# Patient Record
Sex: Female | Born: 1975
Health system: Southern US, Community
[De-identification: ages and names within clinical notes are randomized; demographics above are authoritative.]

## PROBLEM LIST (undated history)

## (undated) DIAGNOSIS — F32A Depression, unspecified: Secondary | ICD-10-CM

## (undated) DIAGNOSIS — M1990 Unspecified osteoarthritis, unspecified site: Secondary | ICD-10-CM

## (undated) DIAGNOSIS — D241 Benign neoplasm of right breast: Secondary | ICD-10-CM

## (undated) DIAGNOSIS — F329 Major depressive disorder, single episode, unspecified: Secondary | ICD-10-CM

## (undated) DIAGNOSIS — Z8614 Personal history of Methicillin resistant Staphylococcus aureus infection: Secondary | ICD-10-CM

## (undated) DIAGNOSIS — F410 Panic disorder [episodic paroxysmal anxiety] without agoraphobia: Secondary | ICD-10-CM

## (undated) HISTORY — DX: Unspecified osteoarthritis, unspecified site: M19.90

---

## 1997-05-26 ENCOUNTER — Other Ambulatory Visit: Admission: RE | Admit: 1997-05-26 | Discharge: 1997-05-26 | Payer: Self-pay | Admitting: Family Medicine

## 1997-08-09 ENCOUNTER — Other Ambulatory Visit: Admission: RE | Admit: 1997-08-09 | Discharge: 1997-08-09 | Payer: Self-pay | Admitting: Family Medicine

## 1997-08-12 ENCOUNTER — Inpatient Hospital Stay (HOSPITAL_COMMUNITY): Admission: AD | Admit: 1997-08-12 | Discharge: 1997-08-12 | Payer: Self-pay | Admitting: Family Medicine

## 1997-09-07 ENCOUNTER — Other Ambulatory Visit: Admission: RE | Admit: 1997-09-07 | Discharge: 1997-09-07 | Payer: Self-pay | Admitting: Family Medicine

## 1998-04-14 ENCOUNTER — Encounter: Payer: Self-pay | Admitting: Emergency Medicine

## 1998-04-14 ENCOUNTER — Emergency Department (HOSPITAL_COMMUNITY): Admission: EM | Admit: 1998-04-14 | Discharge: 1998-04-14 | Payer: Self-pay | Admitting: Emergency Medicine

## 1998-09-14 ENCOUNTER — Emergency Department (HOSPITAL_COMMUNITY): Admission: EM | Admit: 1998-09-14 | Discharge: 1998-09-14 | Payer: Self-pay | Admitting: Emergency Medicine

## 1998-09-15 ENCOUNTER — Encounter: Payer: Self-pay | Admitting: Emergency Medicine

## 1999-05-22 ENCOUNTER — Inpatient Hospital Stay (HOSPITAL_COMMUNITY): Admission: EM | Admit: 1999-05-22 | Discharge: 1999-05-22 | Payer: Self-pay | Admitting: Obstetrics & Gynecology

## 1999-07-31 ENCOUNTER — Emergency Department (HOSPITAL_COMMUNITY): Admission: EM | Admit: 1999-07-31 | Discharge: 1999-07-31 | Payer: Self-pay | Admitting: Emergency Medicine

## 1999-07-31 ENCOUNTER — Encounter: Payer: Self-pay | Admitting: Emergency Medicine

## 1999-08-01 ENCOUNTER — Ambulatory Visit (HOSPITAL_BASED_OUTPATIENT_CLINIC_OR_DEPARTMENT_OTHER): Admission: RE | Admit: 1999-08-01 | Discharge: 1999-08-01 | Payer: Self-pay | Admitting: Orthopedic Surgery

## 1999-08-01 HISTORY — PX: MINOR IRRIGATION AND DEBRIDEMENT OF WOUND: SHX6239

## 1999-08-01 HISTORY — PX: ORIF FINGER FRACTURE: SHX2122

## 1999-08-23 ENCOUNTER — Ambulatory Visit (HOSPITAL_BASED_OUTPATIENT_CLINIC_OR_DEPARTMENT_OTHER): Admission: RE | Admit: 1999-08-23 | Discharge: 1999-08-23 | Payer: Self-pay | Admitting: Orthopedic Surgery

## 1999-08-23 HISTORY — PX: FIXATION DEVICE REMOVAL: SHX950

## 1999-08-23 HISTORY — PX: OSTEOPLASTY MCP / PHALANX: SUR971

## 1999-10-30 ENCOUNTER — Inpatient Hospital Stay (HOSPITAL_COMMUNITY): Admission: AD | Admit: 1999-10-30 | Discharge: 1999-10-30 | Payer: Self-pay | Admitting: *Deleted

## 1999-10-30 ENCOUNTER — Encounter: Payer: Self-pay | Admitting: *Deleted

## 1999-10-31 ENCOUNTER — Other Ambulatory Visit: Admission: RE | Admit: 1999-10-31 | Discharge: 1999-10-31 | Payer: Self-pay | Admitting: Obstetrics

## 1999-11-03 ENCOUNTER — Inpatient Hospital Stay (HOSPITAL_COMMUNITY): Admission: AD | Admit: 1999-11-03 | Discharge: 1999-11-03 | Payer: Self-pay | Admitting: Obstetrics & Gynecology

## 1999-11-11 ENCOUNTER — Inpatient Hospital Stay (HOSPITAL_COMMUNITY): Admission: AD | Admit: 1999-11-11 | Discharge: 1999-11-11 | Payer: Self-pay | Admitting: Obstetrics

## 1999-11-17 ENCOUNTER — Inpatient Hospital Stay (HOSPITAL_COMMUNITY): Admission: AD | Admit: 1999-11-17 | Discharge: 1999-11-17 | Payer: Self-pay | Admitting: Obstetrics

## 2000-01-25 ENCOUNTER — Emergency Department (HOSPITAL_COMMUNITY): Admission: EM | Admit: 2000-01-25 | Discharge: 2000-01-25 | Payer: Self-pay

## 2000-04-11 ENCOUNTER — Observation Stay (HOSPITAL_COMMUNITY): Admission: AD | Admit: 2000-04-11 | Discharge: 2000-04-12 | Payer: Self-pay | Admitting: Obstetrics

## 2000-04-25 ENCOUNTER — Inpatient Hospital Stay (HOSPITAL_COMMUNITY): Admission: AD | Admit: 2000-04-25 | Discharge: 2000-04-25 | Payer: Self-pay | Admitting: Obstetrics

## 2000-06-24 ENCOUNTER — Inpatient Hospital Stay (HOSPITAL_COMMUNITY): Admission: AD | Admit: 2000-06-24 | Discharge: 2000-06-26 | Payer: Self-pay | Admitting: Obstetrics

## 2004-10-02 ENCOUNTER — Emergency Department (HOSPITAL_COMMUNITY): Admission: EM | Admit: 2004-10-02 | Discharge: 2004-10-02 | Payer: Self-pay | Admitting: Emergency Medicine

## 2005-03-08 ENCOUNTER — Inpatient Hospital Stay (HOSPITAL_COMMUNITY): Admission: AD | Admit: 2005-03-08 | Discharge: 2005-03-08 | Payer: Self-pay | Admitting: Obstetrics

## 2005-04-16 ENCOUNTER — Emergency Department (HOSPITAL_COMMUNITY): Admission: EM | Admit: 2005-04-16 | Discharge: 2005-04-17 | Payer: Self-pay | Admitting: Emergency Medicine

## 2005-04-23 ENCOUNTER — Inpatient Hospital Stay (HOSPITAL_COMMUNITY): Admission: AD | Admit: 2005-04-23 | Discharge: 2005-04-23 | Payer: Self-pay | Admitting: Obstetrics

## 2005-08-16 ENCOUNTER — Emergency Department (HOSPITAL_COMMUNITY): Admission: EM | Admit: 2005-08-16 | Discharge: 2005-08-16 | Payer: Self-pay | Admitting: Emergency Medicine

## 2006-11-09 ENCOUNTER — Emergency Department (HOSPITAL_COMMUNITY): Admission: EM | Admit: 2006-11-09 | Discharge: 2006-11-09 | Payer: Self-pay | Admitting: Emergency Medicine

## 2006-11-11 ENCOUNTER — Emergency Department (HOSPITAL_COMMUNITY): Admission: EM | Admit: 2006-11-11 | Discharge: 2006-11-11 | Payer: Self-pay | Admitting: Family Medicine

## 2006-11-11 ENCOUNTER — Emergency Department (HOSPITAL_COMMUNITY): Admission: EM | Admit: 2006-11-11 | Discharge: 2006-11-11 | Payer: Self-pay | Admitting: Emergency Medicine

## 2007-03-15 ENCOUNTER — Emergency Department (HOSPITAL_COMMUNITY): Admission: EM | Admit: 2007-03-15 | Discharge: 2007-03-15 | Payer: Self-pay | Admitting: Family Medicine

## 2007-10-01 ENCOUNTER — Emergency Department (HOSPITAL_COMMUNITY): Admission: EM | Admit: 2007-10-01 | Discharge: 2007-10-01 | Payer: Self-pay | Admitting: Emergency Medicine

## 2009-04-01 ENCOUNTER — Emergency Department (HOSPITAL_COMMUNITY): Admission: EM | Admit: 2009-04-01 | Discharge: 2009-04-01 | Payer: Self-pay | Admitting: Family Medicine

## 2009-11-17 ENCOUNTER — Emergency Department (HOSPITAL_COMMUNITY): Admission: EM | Admit: 2009-11-17 | Discharge: 2009-11-17 | Payer: Self-pay | Admitting: Family Medicine

## 2009-12-21 ENCOUNTER — Emergency Department (HOSPITAL_COMMUNITY): Admission: EM | Admit: 2009-12-21 | Discharge: 2009-12-21 | Payer: Self-pay | Admitting: Emergency Medicine
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
## 2010-09-10 ENCOUNTER — Inpatient Hospital Stay (INDEPENDENT_AMBULATORY_CARE_PROVIDER_SITE_OTHER)
Admission: RE | Admit: 2010-09-10 | Discharge: 2010-09-10 | Disposition: A | Discharge status: Home / Self Care | Payer: Self-pay | Source: Ambulatory Visit | Attending: Family Medicine | Admitting: Family Medicine

## 2010-09-10 DIAGNOSIS — M67919 Unspecified disorder of synovium and tendon, unspecified shoulder: Secondary | ICD-10-CM

## 2010-09-10 DIAGNOSIS — M62838 Other muscle spasm: Secondary | ICD-10-CM

## 2010-09-20 ENCOUNTER — Ambulatory Visit
Admission: RE | Admit: 2010-09-20 | Discharge: 2010-09-20 | Disposition: A | Discharge status: Home / Self Care | Payer: Self-pay | Source: Ambulatory Visit | Attending: Family Medicine | Admitting: Family Medicine

## 2010-09-20 ENCOUNTER — Other Ambulatory Visit: Payer: Self-pay | Admitting: Family Medicine

## 2010-09-20 DIAGNOSIS — M25519 Pain in unspecified shoulder: Secondary | ICD-10-CM

## 2010-11-16 LAB — CULTURE, ROUTINE-ABSCESS

## 2010-12-07 LAB — CULTURE, ROUTINE-ABSCESS

## 2011-01-24 ENCOUNTER — Ambulatory Visit: Payer: Medicaid Other | Attending: Physical Medicine and Rehabilitation | Admitting: Physical Therapy

## 2011-01-24 DIAGNOSIS — R293 Abnormal posture: Secondary | ICD-10-CM | POA: Insufficient documentation

## 2011-01-24 DIAGNOSIS — IMO0001 Reserved for inherently not codable concepts without codable children: Secondary | ICD-10-CM | POA: Insufficient documentation

## 2011-01-24 DIAGNOSIS — M25519 Pain in unspecified shoulder: Secondary | ICD-10-CM | POA: Insufficient documentation

## 2011-02-07 ENCOUNTER — Ambulatory Visit: Payer: Medicaid Other | Attending: Physical Medicine and Rehabilitation | Admitting: Physical Therapy

## 2011-02-07 DIAGNOSIS — IMO0001 Reserved for inherently not codable concepts without codable children: Secondary | ICD-10-CM | POA: Insufficient documentation

## 2011-02-07 DIAGNOSIS — M25519 Pain in unspecified shoulder: Secondary | ICD-10-CM | POA: Insufficient documentation

## 2011-02-07 DIAGNOSIS — R293 Abnormal posture: Secondary | ICD-10-CM | POA: Insufficient documentation

## 2011-02-14 ENCOUNTER — Encounter: Payer: Medicaid Other | Admitting: Physical Therapy

## 2011-02-23 ENCOUNTER — Ambulatory Visit: Payer: Medicaid Other | Admitting: Physical Therapy

## 2011-11-20 ENCOUNTER — Emergency Department (HOSPITAL_COMMUNITY)
Admission: EM | Admit: 2011-11-20 | Discharge: 2011-11-20 | Discharge status: Against Medical Advice | Payer: Self-pay | Attending: Emergency Medicine | Admitting: Emergency Medicine

## 2011-11-20 ENCOUNTER — Encounter (HOSPITAL_COMMUNITY): Payer: Self-pay | Admitting: Emergency Medicine

## 2011-11-20 DIAGNOSIS — R110 Nausea: Secondary | ICD-10-CM | POA: Insufficient documentation

## 2011-11-20 MED ORDER — ONDANSETRON 4 MG PO TBDP
ORAL_TABLET | ORAL | Status: AC
  Filled 2011-11-20: qty 2

## 2011-11-20 MED ORDER — ONDANSETRON 4 MG PO TBDP
8.0000 mg | ORAL_TABLET | Freq: Once | ORAL | Status: AC
  Administered 2011-11-20: 8 mg via ORAL

## 2011-11-20 NOTE — ED Notes (Signed)
Pt c/o feeling nausea since yesterday. Pt ate at [facility] yesterday when symptoms began. Pt denies any pain at present.

## 2011-11-23 ENCOUNTER — Encounter (HOSPITAL_COMMUNITY): Payer: Self-pay

## 2011-11-23 ENCOUNTER — Inpatient Hospital Stay (HOSPITAL_COMMUNITY)
Admission: AD | Admit: 2011-11-23 | Discharge: 2011-11-24 | Disposition: A | Discharge status: Home / Self Care | Payer: Medicaid Other | Source: Ambulatory Visit | Attending: Obstetrics & Gynecology | Admitting: Obstetrics & Gynecology

## 2011-11-23 DIAGNOSIS — R109 Unspecified abdominal pain: Secondary | ICD-10-CM | POA: Insufficient documentation

## 2011-11-23 DIAGNOSIS — N946 Dysmenorrhea, unspecified: Secondary | ICD-10-CM | POA: Insufficient documentation

## 2011-11-23 DIAGNOSIS — R9389 Abnormal findings on diagnostic imaging of other specified body structures: Secondary | ICD-10-CM | POA: Insufficient documentation

## 2011-11-23 DIAGNOSIS — N390 Urinary tract infection, site not specified: Secondary | ICD-10-CM | POA: Insufficient documentation

## 2011-11-23 DIAGNOSIS — R112 Nausea with vomiting, unspecified: Secondary | ICD-10-CM | POA: Insufficient documentation

## 2011-11-23 DIAGNOSIS — R809 Proteinuria, unspecified: Secondary | ICD-10-CM | POA: Insufficient documentation

## 2011-11-23 HISTORY — DX: Personal history of Methicillin resistant Staphylococcus aureus infection: Z86.14

## 2011-11-23 LAB — URINE MICROSCOPIC-ADD ON

## 2011-11-23 LAB — CBC
Hemoglobin: 12.1 g/dL (ref 12.0–15.0)
MCH: 32.7 pg (ref 26.0–34.0)
RBC: 3.7 MIL/uL — ABNORMAL LOW (ref 3.87–5.11)

## 2011-11-23 LAB — HCG, QUANTITATIVE, PREGNANCY: hCG, Beta Chain, Quant, S: <1 m[IU]/mL (ref <5)

## 2011-11-23 LAB — URINALYSIS, ROUTINE W REFLEX MICROSCOPIC
Nitrite: POSITIVE — AB
Protein, ur: >300 mg/dL — AB
Urobilinogen, UA: 1 mg/dL (ref 0.0–1.0)

## 2011-11-23 LAB — POCT PREGNANCY, URINE: Preg Test, Ur: NEGATIVE

## 2011-11-23 MED ORDER — KETOROLAC TROMETHAMINE 60 MG/2ML IM SOLN
60.0000 mg | INTRAMUSCULAR | Status: AC
  Administered 2011-11-24: 60 mg via INTRAMUSCULAR
  Filled 2011-11-23: qty 2

## 2011-11-23 MED ORDER — ONDANSETRON 8 MG PO TBDP
8.0000 mg | ORAL_TABLET | ORAL | Status: AC
  Administered 2011-11-24: 8 mg via ORAL
  Filled 2011-11-23: qty 1

## 2011-11-23 NOTE — MAU Note (Signed)
Patient is in with c/o intense nausea, constant lower back and abdominal pain. She denies or vaginal discharge. She states that she started having brown blood and only when she wipe today. She states that she period twice in august (3rd and 30th).

## 2011-11-23 NOTE — MAU Note (Signed)
Extremely nauseated for 4-5 days. No vomiting. Today started having cramping in lower abdomen. Had brown vag spotting today and then bright red. Mostly sees bright blood when wipes

## 2011-11-23 NOTE — MAU Provider Note (Signed)
Chief Complaint: Nausea and Abdominal Cramping   First Provider Initiated Contact with Patient 11/23/11 2328     SUBJECTIVE HPI: Julia Newman is a 36 y.o. [id number] who presents to maternity admissions reporting a faintly positive preg test at home 2 days ago and dark brown spotting leading to heavier bright red bleeding today like a normal period.  Patient's last menstrual period was 11/23/2011.  She is also having abdominal cramping with sharp pain on the right side and nausea x2 days with vomiting 1/day.  She denies vaginal itching/burning, urinary symptoms, h/a, dizziness, or fever/chills.    Pt has regular Gyn care/Paps with a family practice physician outside the [facility] system. She desires to change providers and is interested in the hospital Gyn clinic.  Past Medical History  Diagnosis Date  . Hx MRSA infection     on face and upper thigh   Past Surgical History  Procedure Date  . Finger surgery    History   Social History  . Marital Status: Single    Spouse Name: N/A    Number of Children: N/A  . Years of Education: N/A   Occupational History  . Not on file.   Social History Main Topics  . Smoking status: Current Every Day Smoker    Types: Cigarettes  . Smokeless tobacco: Not on file  . Alcohol Use: Yes     Social  . Drug Use: No  . Sexually Active: Yes    Birth Control/ Protection: None   Other Topics Concern  . Not on file   Social History Narrative  . No narrative on file   No current facility-administered medications on file prior to encounter.   No current outpatient prescriptions on file prior to encounter.   No Known Allergies  ROS: Pertinent items in HPI  OBJECTIVE Blood pressure 120/77, pulse 78, temperature 98.1 F (36.7 C), temperature source Oral, resp. rate 20, height 5' 3.5" (1.613 m), weight 102.331 kg (225 lb 9.6 oz), last menstrual period 11/23/2011. GENERAL: Well-developed, well-nourished female in no acute distress.  HEENT:  Normocephalic HEART: normal rate RESP: normal effort ABDOMEN: Soft, mildly tender in suprapubic region and RLQ, no rebound tenderness, guarding, or tenderness at McBurney's point Negative CVA tenderness EXTREMITIES: Nontender, no edema NEURO: Alert and oriented Pelvic exam: Cervix pink, visually closed, with nabothian cysts, moderate amount bright red vaginal bleeding without clots, vaginal walls and external genitalia normal Bimanual exam: Cervix 0/long/high, firm, anterior, neg CMT, uterus mildly tender, nonenlarged, mild tenderness in R adnexal region, none on left, no enlargement or mass bilaterally  LAB RESULTS Results for orders placed during the hospital encounter of 11/23/11 (from the past 24 hour(s))  URINALYSIS, ROUTINE W REFLEX MICROSCOPIC     Status: Abnormal   Collection Time   11/23/11 10:35 PM      Component Value Range   Color, Urine RED (*) YELLOW   APPearance CLOUDY (*) CLEAR   Specific Gravity, Urine >1.030 (*) 1.005 - 1.030   pH 5.0  5.0 - 8.0   Glucose, UA NEGATIVE  NEGATIVE mg/dL   Hgb urine dipstick LARGE (*) NEGATIVE   Bilirubin Urine NEGATIVE  NEGATIVE   Ketones, ur 15 (*) NEGATIVE mg/dL   Protein, ur >454 (*) NEGATIVE mg/dL   Urobilinogen, UA 1.0  0.0 - 1.0 mg/dL   Nitrite POSITIVE (*) NEGATIVE   Leukocytes, UA TRACE (*) NEGATIVE  URINE MICROSCOPIC-ADD ON     Status: Abnormal   Collection Time   11/23/11 10:35 PM  Component Value Range   Squamous Epithelial / LPF FEW (*) RARE   WBC, UA 3-6  <3 WBC/hpf   RBC / HPF TOO NUMEROUS TO COUNT  <3 RBC/hpf   Bacteria, UA MANY (*) RARE  POCT PREGNANCY, URINE     Status: Normal   Collection Time   11/23/11 10:37 PM      Component Value Range   Preg Test, Ur NEGATIVE  NEGATIVE  ABO/RH     Status: Normal   Collection Time   11/23/11 11:07 PM      Component Value Range   ABO/RH(D) O POS    CBC     Status: Abnormal   Collection Time   11/23/11 11:08 PM      Component Value Range   WBC 7.3  4.0 - 10.5 K/uL    RBC 3.70 (*) 3.87 - 5.11 MIL/uL   Hemoglobin 12.1  12.0 - 15.0 g/dL   HCT 11.9  14.7 - 82.9 %   MCV 99.7  78.0 - 100.0 fL   MCH 32.7  26.0 - 34.0 pg   MCHC 32.8  30.0 - 36.0 g/dL   RDW 56.2  13.0 - 86.5 %   Platelets 279  150 - 400 K/uL    IMAGING US Transvaginal Non-ob  11/24/2011  *RADIOLOGY REPORT*  Clinical Data: Right lower quadrant pain.  Abnormal uterine bleeding  TRANSABDOMINAL AND TRANSVAGINAL ULTRASOUND OF PELVIS Technique:  Both transabdominal and transvaginal ultrasound examinations of the pelvis were performed. Transabdominal technique was performed for global imaging of the pelvis including uterus, ovaries, adnexal regions, and pelvic cul-de-sac.  It was necessary to proceed with endovaginal exam following the transabdominal exam to visualize the the ovaries and endometrium.  Comparison:  None  Findings:  Uterus: Normal in size of 10.3 x 5.5 x 5.6 cm.  Endometrium: Endometrium is thickened and heterogeneous in echotexture measuring 16 mm.  Right ovary:  Normal in size and appearance measuring 3.2 feet 2.9 x 1.8 cm.  Left ovary: Normal size and appearance measuring 2.9 1.5 x 2.2 cm.  Other findings: No free fluid  IMPRESSION:  Thickened and heterogeneous endometrium suggesting blood products within the canal.  Recommend follow-up ultrasound in 6 weeks to demonstrate resolution.   Original Report Authenticated By: Genevive Bi, M.D.    US Pelvis Complete  11/24/2011  *RADIOLOGY REPORT*  Clinical Data: Right lower quadrant pain.  Abnormal uterine bleeding  TRANSABDOMINAL AND TRANSVAGINAL ULTRASOUND OF PELVIS Technique:  Both transabdominal and transvaginal ultrasound examinations of the pelvis were performed. Transabdominal technique was performed for global imaging of the pelvis including uterus, ovaries, adnexal regions, and pelvic cul-de-sac.  It was necessary to proceed with endovaginal exam following the transabdominal exam to visualize the the ovaries and endometrium.  Comparison:   None  Findings:  Uterus: Normal in size of 10.3 x 5.5 x 5.6 cm.  Endometrium: Endometrium is thickened and heterogeneous in echotexture measuring 16 mm.  Right ovary:  Normal in size and appearance measuring 3.2 feet 2.9 x 1.8 cm.  Left ovary: Normal size and appearance measuring 2.9 1.5 x 2.2 cm.  Other findings: No free fluid  IMPRESSION:  Thickened and heterogeneous endometrium suggesting blood products within the canal.  Recommend follow-up ultrasound in 6 weeks to demonstrate resolution.   Original Report Authenticated By: Genevive Bi, M.D.     ASSESSMENT 1. UTI (lower urinary tract infection)   2. Nausea & vomiting   3. Thickened endometrium   4. Dysmenorrhea   5. Proteinuria  Some suspicion for pyelonephritis but n/v only indicator with normal WBC count and no CVA tenderness  PLAN Toradol 60mg  IM x1 dose in MAU with some relief of pain Discharge home Urine sent for culture Cipro 500 mg PO BID x7 days Zofran 8mg  ODT Q8 hours Ibuprofen 600 mg PO Q6 hours Percocet 5/325, 1-2 tabs Q8 hours PRN x15 tabs Message sent to Gyn clinic for f/u and endometrial biopsy Return to MAU with worsening symptoms or lack of improvement   Sharen Counter Certified Nurse-Midwife 11/23/2011  11:46 PM

## 2011-11-24 ENCOUNTER — Inpatient Hospital Stay (HOSPITAL_COMMUNITY): Payer: Medicaid Other

## 2011-11-24 DIAGNOSIS — R809 Proteinuria, unspecified: Secondary | ICD-10-CM | POA: Diagnosis present

## 2011-11-24 DIAGNOSIS — N390 Urinary tract infection, site not specified: Secondary | ICD-10-CM

## 2011-11-24 DIAGNOSIS — N946 Dysmenorrhea, unspecified: Secondary | ICD-10-CM | POA: Diagnosis present

## 2011-11-24 DIAGNOSIS — R9389 Abnormal findings on diagnostic imaging of other specified body structures: Secondary | ICD-10-CM | POA: Diagnosis present

## 2011-11-24 MED ORDER — ONDANSETRON 8 MG PO TBDP: 8.0000 mg | ORAL_TABLET | Freq: Three times a day (TID) | ORAL | Status: DC | PRN

## 2011-11-24 MED ORDER — CIPROFLOXACIN HCL 500 MG PO TABS: 500.0000 mg | ORAL_TABLET | Freq: Two times a day (BID) | ORAL | Status: DC

## 2011-11-24 MED ORDER — OXYCODONE-ACETAMINOPHEN 5-325 MG PO TABS: 1.0000 | ORAL_TABLET | Freq: Four times a day (QID) | ORAL | Status: DC | PRN

## 2011-11-24 MED ORDER — IBUPROFEN 600 MG PO TABS: 600.0000 mg | ORAL_TABLET | Freq: Four times a day (QID) | ORAL | Status: DC | PRN

## 2011-11-25 LAB — URINE CULTURE: Colony Count: NO GROWTH

## 2011-11-26 LAB — GC/CHLAMYDIA PROBE AMP, GENITAL: GC Probe Amp, Genital: NEGATIVE

## 2011-12-19 ENCOUNTER — Other Ambulatory Visit: Payer: Medicaid Other | Admitting: Advanced Practice Midwife

## 2012-04-26 ENCOUNTER — Inpatient Hospital Stay (HOSPITAL_COMMUNITY)
Admission: AD | Admit: 2012-04-26 | Discharge: 2012-04-27 | Disposition: A | Discharge status: Home / Self Care | Payer: Medicaid Other | Source: Ambulatory Visit | Attending: Obstetrics & Gynecology | Admitting: Obstetrics & Gynecology

## 2012-04-26 ENCOUNTER — Inpatient Hospital Stay (HOSPITAL_COMMUNITY): Payer: Medicaid Other

## 2012-04-26 ENCOUNTER — Encounter (HOSPITAL_COMMUNITY): Payer: Self-pay | Admitting: *Deleted

## 2012-04-26 DIAGNOSIS — N841 Polyp of cervix uteri: Secondary | ICD-10-CM

## 2012-04-26 DIAGNOSIS — N938 Other specified abnormal uterine and vaginal bleeding: Secondary | ICD-10-CM | POA: Insufficient documentation

## 2012-04-26 DIAGNOSIS — N949 Unspecified condition associated with female genital organs and menstrual cycle: Secondary | ICD-10-CM | POA: Insufficient documentation

## 2012-04-26 DIAGNOSIS — N840 Polyp of corpus uteri: Secondary | ICD-10-CM | POA: Insufficient documentation

## 2012-04-26 LAB — URINALYSIS, ROUTINE W REFLEX MICROSCOPIC
Bilirubin Urine: NEGATIVE
Specific Gravity, Urine: >1.03 — ABNORMAL HIGH (ref 1.005–1.030)
Urobilinogen, UA: 0.2 mg/dL (ref 0.0–1.0)
pH: 5.5 (ref 5.0–8.0)

## 2012-04-26 LAB — CBC
MCH: 33.2 pg (ref 26.0–34.0)
Platelets: 259 10*3/uL (ref 150–400)
RBC: 3.67 MIL/uL — ABNORMAL LOW (ref 3.87–5.11)

## 2012-04-26 LAB — URINE MICROSCOPIC-ADD ON

## 2012-04-26 MED ORDER — IBUPROFEN 600 MG PO TABS
600.0000 mg | ORAL_TABLET | Freq: Once | ORAL | Status: AC
  Administered 2012-04-26: 600 mg via ORAL
  Filled 2012-04-26: qty 1

## 2012-04-26 NOTE — MAU Provider Note (Signed)
History     CSN: [id number]  Arrival date and time: 04/26/12 2114   None     Chief Complaint  Patient presents with  . Vaginal Bleeding   HPI  Pt is a [id number] here with report of Patient's last menstrual period was 04/23/2012.  No report of cramping.  No history of fibroids.  Reports a regular cycle.  +concern due to blood clot that fell to floor that was hard.  It was approximate size of grape.  Bleeding is reported as light as this time.     Past Medical History  Diagnosis Date  . Hx MRSA infection     on face and upper thigh    Past Surgical History  Procedure Laterality Date  . Finger surgery      History reviewed. No pertinent family history.  History  Substance Use Topics  . Smoking status: Current Every Day Smoker    Types: Cigarettes  . Smokeless tobacco: Not on file  . Alcohol Use: Yes     Comment: Social    Allergies: No Known Allergies  Prescriptions prior to admission  Medication Sig Dispense Refill  . Ibuprofen (MIDOL) 200 MG CAPS Take 2-4 capsules by mouth daily as needed (For pain).        Review of Systems  Genitourinary:       Vaginal bleeding  Neurological: Positive for headaches.  All other systems reviewed and are negative.   Physical Exam   Blood pressure 124/77, pulse 72, temperature 98.1 F (36.7 C), temperature source Oral, resp. rate 18, last menstrual period 04/23/2012.  Physical Exam  Constitutional: She is oriented to person, place, and time. She appears well-developed and well-nourished. No distress.  HENT:  Head: Normocephalic.  Neck: Normal range of motion. Neck supple.  Cardiovascular: Normal rate, regular rhythm and normal heart sounds.   Respiratory: Effort normal and breath sounds normal.  GI: Soft. There is no tenderness.  Genitourinary: Bleeding: negative clots.  Scant bleeding; shown tissue by pt that came out of vagina at home > polyp like lesion.  Exam - small stalk remaining in cervical os > removed by Dr.  Macon Large  Neurological: She is alert and oriented to person, place, and time. She has normal reflexes.  Skin: Skin is warm and dry.    MAU Course  Procedures Results for orders placed during the hospital encounter of 04/26/12 (from the past 24 hour(s))  URINALYSIS, ROUTINE W REFLEX MICROSCOPIC     Status: Abnormal   Collection Time    04/26/12  9:18 PM      Result Value Range   Color, Urine YELLOW  YELLOW   APPearance CLEAR  CLEAR   Specific Gravity, Urine >1.030 (*) 1.005 - 1.030   pH 5.5  5.0 - 8.0   Glucose, UA NEGATIVE  NEGATIVE mg/dL   Hgb urine dipstick MODERATE (*) NEGATIVE   Bilirubin Urine NEGATIVE  NEGATIVE   Ketones, ur NEGATIVE  NEGATIVE mg/dL   Protein, ur NEGATIVE  NEGATIVE mg/dL   Urobilinogen, UA 0.2  0.0 - 1.0 mg/dL   Nitrite NEGATIVE  NEGATIVE   Leukocytes, UA NEGATIVE  NEGATIVE  URINE MICROSCOPIC-ADD ON     Status: Abnormal   Collection Time    04/26/12  9:18 PM      Result Value Range   Squamous Epithelial / LPF FEW (*) RARE   WBC, UA 0-2  <3 WBC/hpf   RBC / HPF 3-6  <3 RBC/hpf   Bacteria, UA FEW (*) RARE  POCT PREGNANCY, URINE     Status: None   Collection Time    04/26/12  9:29 PM      Result Value Range   Preg Test, Ur NEGATIVE  NEGATIVE  CBC     Status: Abnormal   Collection Time    04/26/12 10:23 PM      Result Value Range   WBC 7.1  4.0 - 10.5 K/uL   RBC 3.67 (*) 3.87 - 5.11 MIL/uL   Hemoglobin 12.2  12.0 - 15.0 g/dL   HCT 16.1  09.6 - 04.5 %   MCV 100.5 (*) 78.0 - 100.0 fL   MCH 33.2  26.0 - 34.0 pg   MCHC 33.1  30.0 - 36.0 g/dL   RDW 40.9  81.1 - 91.4 %   Platelets 259  150 - 400 K/uL   Ultrasound: IMPRESSION:  1. Four to five mm focus of heterogeneous endometrial echotexture  and suggestion of some heterogeneous endometrial vascularity on  power Doppler imaging. Overall endometrial thickness 6 mm. Follow-  up sonohystogram may be most helpful.  2. Otherwise normal uterus and adnexa. No pelvic free fluid.   Assessment and Plan   Polyp  Plan: Follow-up in gyn clinic Report increased bleeding or pain.  [facility] 04/26/2012, 10:11 PM

## 2012-04-26 NOTE — MAU Note (Signed)
Started her menstrual period on Wednesday. Passed a hard clot on the floor today and wanted to come get checked out.

## 2012-04-26 NOTE — MAU Note (Signed)
Hot flashed and headache

## 2012-04-27 DIAGNOSIS — N841 Polyp of cervix uteri: Secondary | ICD-10-CM

## 2012-04-28 NOTE — MAU Provider Note (Signed)

## 2012-08-07 ENCOUNTER — Emergency Department (HOSPITAL_COMMUNITY)
Admission: EM | Admit: 2012-08-07 | Discharge: 2012-08-07 | Disposition: A | Discharge status: Home / Self Care | Payer: Medicaid Other | Attending: Emergency Medicine | Admitting: Emergency Medicine

## 2012-08-07 ENCOUNTER — Emergency Department (HOSPITAL_COMMUNITY): Payer: Medicaid Other

## 2012-08-07 ENCOUNTER — Encounter (HOSPITAL_COMMUNITY): Payer: Self-pay | Admitting: *Deleted

## 2012-08-07 DIAGNOSIS — Z8614 Personal history of Methicillin resistant Staphylococcus aureus infection: Secondary | ICD-10-CM | POA: Insufficient documentation

## 2012-08-07 DIAGNOSIS — R079 Chest pain, unspecified: Secondary | ICD-10-CM | POA: Insufficient documentation

## 2012-08-07 DIAGNOSIS — R609 Edema, unspecified: Secondary | ICD-10-CM | POA: Insufficient documentation

## 2012-08-07 DIAGNOSIS — M549 Dorsalgia, unspecified: Secondary | ICD-10-CM | POA: Insufficient documentation

## 2012-08-07 DIAGNOSIS — F17200 Nicotine dependence, unspecified, uncomplicated: Secondary | ICD-10-CM | POA: Insufficient documentation

## 2012-08-07 DIAGNOSIS — R0602 Shortness of breath: Secondary | ICD-10-CM | POA: Insufficient documentation

## 2012-08-07 LAB — CBC
HCT: 35.9 % — ABNORMAL LOW (ref 36.0–46.0)
Platelets: 241 10*3/uL (ref 150–400)
RDW: 12.7 % (ref 11.5–15.5)
WBC: 6.4 10*3/uL (ref 4.0–10.5)

## 2012-08-07 LAB — POCT I-STAT TROPONIN I: Troponin i, poc: 0 ng/mL (ref 0.00–0.08)

## 2012-08-07 LAB — BASIC METABOLIC PANEL
CO2: 23 mEq/L (ref 19–32)
Calcium: 9.2 mg/dL (ref 8.4–10.5)
GFR calc non Af Amer: >90 mL/min (ref >90)
Sodium: 136 mEq/L (ref 135–145)

## 2012-08-07 MED ORDER — MORPHINE SULFATE 4 MG/ML IJ SOLN
4.0000 mg | Freq: Once | INTRAMUSCULAR | Status: AC
  Administered 2012-08-07: 4 mg via INTRAVENOUS
  Filled 2012-08-07: qty 1

## 2012-08-07 MED ORDER — ASPIRIN 81 MG PO CHEW
324.0000 mg | CHEWABLE_TABLET | Freq: Once | ORAL | Status: AC
  Administered 2012-08-07: 324 mg via ORAL
  Filled 2012-08-07: qty 4

## 2012-08-07 MED ORDER — POTASSIUM CHLORIDE CRYS ER 20 MEQ PO TBCR
20.0000 meq | EXTENDED_RELEASE_TABLET | Freq: Once | ORAL | Status: AC
  Administered 2012-08-07: 20 meq via ORAL
  Filled 2012-08-07: qty 1

## 2012-08-07 MED ORDER — KETOROLAC TROMETHAMINE 30 MG/ML IJ SOLN
30.0000 mg | Freq: Once | INTRAMUSCULAR | Status: AC
  Administered 2012-08-07: 30 mg via INTRAVENOUS
  Filled 2012-08-07: qty 1

## 2012-08-07 MED ORDER — ONDANSETRON HCL 4 MG/2ML IJ SOLN
4.0000 mg | Freq: Once | INTRAMUSCULAR | Status: AC
  Administered 2012-08-07: 4 mg via INTRAVENOUS
  Filled 2012-08-07: qty 2

## 2012-08-07 MED ORDER — LORAZEPAM 2 MG/ML IJ SOLN
1.0000 mg | Freq: Once | INTRAMUSCULAR | Status: AC
  Administered 2012-08-07: 1 mg via INTRAVENOUS
  Filled 2012-08-07: qty 1

## 2012-08-07 NOTE — ED Provider Notes (Signed)
History     CSN: [id number]  Arrival date & time 08/07/12  3086   First MD Initiated Contact with Patient 08/07/12 0158      Chief Complaint  Patient presents with  . Chest Pain    (Consider location/radiation/quality/duration/timing/severity/associated sxs/prior treatment) HPI Hx per PT - L sided back pain x 3 days now radiating to her L neck and L chest. C/o inc stress recently. SOB today. No N/V. No diaphoresis. No trauma.  No cough. No F/C. No leg pain, some chronic LE edema unchanged. Mod in severity. C/o depression "all of my life" denies any SI/ HI.    Past Medical History  Diagnosis Date  . Hx MRSA infection     on face and upper thigh    Past Surgical History  Procedure Laterality Date  . Finger surgery      No family history on file.  History  Substance Use Topics  . Smoking status: Current Some Day Smoker    Types: Cigarettes  . Smokeless tobacco: Not on file  . Alcohol Use: Yes     Comment: Social    OB History   Grav Para Term Preterm Abortions TAB SAB Ect Mult Living   6 3 2 1 3 2 1   2       Review of Systems  Constitutional: Negative for fever and chills.  HENT: Negative for neck pain and neck stiffness.   Eyes: Negative for pain.  Respiratory: Positive for shortness of breath.   Cardiovascular: Positive for chest pain.  Gastrointestinal: Negative for abdominal pain.  Genitourinary: Negative for dysuria.  Musculoskeletal: Negative for back pain.  Skin: Negative for rash.  Neurological: Negative for headaches.  All other systems reviewed and are negative.    Allergies  Review of patient's allergies indicates no known allergies.  Home Medications   Current Outpatient Rx  Name  Route  Sig  Dispense  Refill  . Ibuprofen (MIDOL) 200 MG CAPS   Oral   Take 2-4 capsules by mouth daily as needed (For pain).           BP 118/67  Pulse 79  Temp(Src) 98.6 F (37 C) (Oral)  Resp 18  Ht 5' 3.5" (1.613 m)  SpO2 100%  LMP  07/20/2012  Physical Exam  Constitutional: She is oriented to person, place, and time. She appears well-developed and well-nourished.  HENT:  Head: Normocephalic and atraumatic.  Eyes: Conjunctivae and EOM are normal. Pupils are equal, round, and reactive to light.  Neck: Trachea normal. Neck supple. No thyromegaly present.  Cardiovascular: Normal rate, regular rhythm, S1 normal, S2 normal and normal pulses.     No systolic murmur is present   No diastolic murmur is present  Pulses:      Radial pulses are 2+ on the right side, and 2+ on the left side.  Pulmonary/Chest: Effort normal and breath sounds normal. She has no wheezes. She has no rhonchi. She has no rales. She exhibits no tenderness.  Abdominal: Soft. Normal appearance and bowel sounds are normal. There is no tenderness. There is no CVA tenderness and negative Murphy's sign.  Musculoskeletal:  1 plus symmetric pretibial edema. calves nontender, no cords or erythema, negative Homans sign.  Neurological: She is alert and oriented to person, place, and time. She has normal strength. No cranial nerve deficit or sensory deficit. GCS eye subscore is 4. GCS verbal subscore is 5. GCS motor subscore is 6.  Skin: Skin is warm and dry. No rash noted.  She is not diaphoretic.  Psychiatric: She has a normal mood and affect. Her speech is normal.  Cooperative and appropriate    ED Course  Procedures (including critical care time)  Results for orders placed during the hospital encounter of 08/07/12  CBC      Result Value Range   WBC 6.4  4.0 - 10.5 K/uL   RBC 3.65 (*) 3.87 - 5.11 MIL/uL   Hemoglobin 12.0  12.0 - 15.0 g/dL   HCT 40.9 (*) 81.1 - 91.4 %   MCV 98.4  78.0 - 100.0 fL   MCH 32.9  26.0 - 34.0 pg   MCHC 33.4  30.0 - 36.0 g/dL   RDW 78.2  95.6 - 21.3 %   Platelets 241  150 - 400 K/uL  BASIC METABOLIC PANEL      Result Value Range   Sodium 136  135 - 145 mEq/L   Potassium 3.4 (*) 3.5 - 5.1 mEq/L   Chloride 102  96 - 112  mEq/L   CO2 23  19 - 32 mEq/L   Glucose, Bld 100 (*) 70 - 99 mg/dL   BUN 5 (*) 6 - 23 mg/dL   Creatinine, Ser 0.86  0.50 - 1.10 mg/dL   Calcium 9.2  8.4 - 57.8 mg/dL   GFR calc non Af Amer >90  >90 mL/min   GFR calc Af Amer >90  >90 mL/min  POCT I-STAT TROPONIN I      Result Value Range   Troponin i, poc 0.00  0.00 - 0.08 ng/mL   Comment 3           POCT I-STAT TROPONIN I      Result Value Range   Troponin i, poc 0.00  0.00 - 0.08 ng/mL   Comment 3            Dg Chest Port 1 View  08/07/2012   *RADIOLOGY REPORT*  Clinical Data: Chest pain  PORTABLE CHEST - 1 VIEW  Comparison: None.  Findings: The heart size and pulmonary vascularity are normal. The lungs appear clear and expanded without focal air space disease or consolidation. No blunting of the costophrenic angles.  No pneumothorax.  Mediastinal contours appear intact.  IMPRESSION: No evidence of active pulmonary disease.   Original Report Authenticated By: Burman Nieves, M.D.     Date: 08/07/2012  Rate: 70  Rhythm: normal sinus rhythm  QRS Axis: normal  Intervals: normal  ST/T Wave abnormalities: nonspecific ST changes  Conduction Disutrbances:none  Narrative Interpretation:   Old EKG Reviewed: none available   ASA, IV morphine Ativan and toradol for some persistent pain and anxiety  Recheck after medications / pain resolved. Serial troponins negative. Plan outpatient follow up and stress testing. CP precautions provided and verbalized as understood.  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   MDM  CP low risk for ACS.   Evaluated with ECG, labs, CXR  Medications including IV narcotics provided  VS and nursing notes reviewed/ considered      Sunnie Nielsen, MD 08/08/12 [phone]

## 2012-08-07 NOTE — ED Notes (Signed)
Pt states that she began to have left scapula pain 4-5 days ago that was like a dull, throbbing and aching; pt states that the pain has progressed and expanded to left shoulder and left side of neck and then tonight pt states that it expanded to include her central chest; pt states that the left scapular and shoulder and neck have continued; pt c/o feeling short of breath, nausea and dizziness; pt states that she is homeless and has been living in her car; pt also c/o swelling to lower extremities; pt states that she had the swelling last year but states that the workup by her MD was negative.

## 2013-05-21 ENCOUNTER — Other Ambulatory Visit: Payer: Self-pay | Admitting: Family Medicine

## 2013-05-21 DIAGNOSIS — N6310 Unspecified lump in the right breast, unspecified quadrant: Secondary | ICD-10-CM

## 2013-05-29 ENCOUNTER — Other Ambulatory Visit: Payer: Self-pay | Admitting: Family Medicine

## 2013-05-29 ENCOUNTER — Ambulatory Visit
Admission: RE | Admit: 2013-05-29 | Discharge: 2013-05-29 | Disposition: A | Discharge status: Home / Self Care | Payer: Medicaid Other | Source: Ambulatory Visit | Attending: Family Medicine | Admitting: Family Medicine

## 2013-05-29 DIAGNOSIS — N6310 Unspecified lump in the right breast, unspecified quadrant: Secondary | ICD-10-CM

## 2013-05-29 DIAGNOSIS — N63 Unspecified lump in unspecified breast: Secondary | ICD-10-CM

## 2013-06-11 ENCOUNTER — Other Ambulatory Visit: Payer: Self-pay | Admitting: Family Medicine

## 2013-06-11 ENCOUNTER — Ambulatory Visit
Admission: RE | Admit: 2013-06-11 | Discharge: 2013-06-11 | Disposition: A | Discharge status: Home / Self Care | Payer: Medicaid Other | Source: Ambulatory Visit | Attending: Family Medicine | Admitting: Family Medicine

## 2013-06-11 DIAGNOSIS — N6310 Unspecified lump in the right breast, unspecified quadrant: Secondary | ICD-10-CM

## 2013-06-11 DIAGNOSIS — N63 Unspecified lump in unspecified breast: Secondary | ICD-10-CM

## 2013-06-24 ENCOUNTER — Emergency Department (INDEPENDENT_AMBULATORY_CARE_PROVIDER_SITE_OTHER): Payer: Medicaid Other

## 2013-06-24 ENCOUNTER — Encounter (HOSPITAL_COMMUNITY): Payer: Self-pay | Admitting: Emergency Medicine

## 2013-06-24 ENCOUNTER — Emergency Department (INDEPENDENT_AMBULATORY_CARE_PROVIDER_SITE_OTHER)
Admission: EM | Admit: 2013-06-24 | Discharge: 2013-06-24 | Disposition: A | Discharge status: Home / Self Care | Payer: Medicaid Other | Source: Home / Self Care | Attending: Family Medicine | Admitting: Family Medicine

## 2013-06-24 DIAGNOSIS — M65849 Other synovitis and tenosynovitis, unspecified hand: Secondary | ICD-10-CM

## 2013-06-24 DIAGNOSIS — M778 Other enthesopathies, not elsewhere classified: Secondary | ICD-10-CM

## 2013-06-24 DIAGNOSIS — M65839 Other synovitis and tenosynovitis, unspecified forearm: Secondary | ICD-10-CM

## 2013-06-24 MED ORDER — PREDNISONE 10 MG PO KIT: PACK | ORAL | Status: DC

## 2013-06-24 NOTE — ED Provider Notes (Signed)
Julia Newman is a 38 y.o. female who presents to Urgent Care today for left wrist pain. Patient has pain in her left volar wrist present for the past 3 days. The pain is moderate and worse with palpation. The pain extends proximally to the elbow. The pain will wake her from night. She describes radiating pain as "like a lightening bolt." She denies any injury but notes that she has been using her wrist much more recently in preparation for a craft fair. She denies any finger numbness or tingling or weakness. She feels well otherwise. She has not tried any medications yet.   Past Medical History  Diagnosis Date  . Hx MRSA infection     on face and upper thigh   History  Substance Use Topics  . Smoking status: Current Some Day Smoker    Types: Cigarettes  . Smokeless tobacco: Not on file  . Alcohol Use: Yes     Comment: Social   ROS as above Medications: No current facility-administered medications for this encounter.   Current Outpatient Prescriptions  Medication Sig Dispense Refill  . ALPRAZolam (XANAX PO) Take by mouth.      . Divalproex Sodium (DEPAKOTE PO) Take by mouth.      . FLUOXETINE HCL PO Take by mouth.      Marland Kitchen ibuprofen (ADVIL,MOTRIN) 200 MG tablet Take 200-400 mg by mouth every 6 (six) hours as needed for pain.      . MELOXICAM PO Take by mouth.      Marland Kitchen PARoxetine HCl (PAXIL PO) Take by mouth.      . PredniSONE 10 MG KIT 12 day dose pack po  1 kit  0    Exam:  BP 109/64  Pulse 65  Temp(Src) 98 F (36.7 C) (Oral)  Resp 18  SpO2 100%  LMP 06/09/2013 Gen: Well NAD Left wrist:  Normal-appearing no swelling. Tender palpation volar wrist.  Nontender anatomical snuff box.  Negative Finkelstein's test.  Mildly positive Tinel's test.  Positive Phalen's test.  Forearm and elbow are normal appearing and nontender with normal motion.  Limited musculoskeletal ultrasound of the left volar wrist: Median nerve at the carpal tunnel measures 0.8 cm (cuff value of 1.5 cm  square)  Hypoechoic fluid tracking around the flexor tendons in the carpal tunnel.  Tendinous structures are intact.  Normal-appearing bony structures.  Consistent with tenosynovitis  No results found for this or any previous visit (from the past 24 hour(s)). Dg Wrist Complete Left  06/24/2013   CLINICAL DATA:  Left wrist pain  EXAM: LEFT WRIST - COMPLETE 3+ VIEW  COMPARISON:  None.  FINDINGS: There is no evidence of fracture or dislocation. There is no evidence of arthropathy or other focal bone abnormality. Soft tissues are unremarkable.  IMPRESSION: Negative.   Electronically Signed   By: Kathreen Devoid   On: 06/24/2013 11:07    Assessment and Plan: 38 y.o. female with left wrist overuse tenosynovitis.  Ultrasound not diagnostic for carpal tunnel syndrome. Plan to treat with prednisone Dosepak and wrist brace. Followup with hand surgery not improved.  Discussed warning signs or symptoms. Please see discharge instructions. Patient expresses understanding.    Gregor Hams, MD 06/24/13 1149

## 2013-06-24 NOTE — Discharge Instructions (Signed)
Thank you for coming in today. Take prednisone daily for 12 days. Use a wrist brace for at least one week. Also wear  while sleeping. If not better in 2 weeks follow up with hand surgery office.  Carpal Tunnel Syndrome The carpal tunnel is a narrow area located on the palm side of your wrist. The tunnel is formed by the wrist bones and ligaments. Nerves, blood vessels, and tendons pass through the carpal tunnel. Repeated wrist motion or certain diseases may cause swelling within the tunnel. This swelling pinches the main nerve in the wrist (median nerve) and causes the painful hand and arm condition called carpal tunnel syndrome. CAUSES   Repeated wrist motions.  Wrist injuries.  Certain diseases like arthritis, diabetes, alcoholism, hyperthyroidism, and kidney failure.  Obesity.  Pregnancy. SYMPTOMS   A "pins and needles" feeling in your fingers or hand.  Tingling or numbness in your fingers or hand.  An aching feeling in your entire arm.  Wrist pain that goes up your arm to your shoulder.  Pain that goes down into your palm or fingers.  A weak feeling in your hands. DIAGNOSIS  Your caregiver will take your history and perform a physical exam. An electromyography test may be needed. This test measures electrical signals sent out by the muscles. The electrical signals are usually slowed by carpal tunnel syndrome. You may also need X-rays. TREATMENT  Carpal tunnel syndrome may clear up by itself. Your caregiver may recommend a wrist splint or medicine such as a nonsteroidal anti-inflammatory medicine. Cortisone injections may help. Sometimes, surgery may be needed to free the pinched nerve.  HOME CARE INSTRUCTIONS   Take all medicine as directed by your caregiver. Only take over-the-counter or prescription medicines for pain, discomfort, or fever as directed by your caregiver.  If you were given a splint to keep your wrist from bending, wear it as directed. It is important to  wear the splint at night. Wear the splint for as long as you have pain or numbness in your hand, arm, or wrist. This may take 1 to 2 months.  Rest your wrist from any activity that may be causing your pain. If your symptoms are work-related, you may need to talk to your employer about changing to a job that does not require using your wrist.  Put ice on your wrist after long periods of wrist activity.  Put ice in a plastic bag.  Place a towel between your skin and the bag.  Leave the ice on for 15-20 minutes, 03-04 times a day.  Keep all follow-up visits as directed by your caregiver. This includes any orthopedic referrals, physical therapy, and rehabilitation. Any delay in getting necessary care could result in a delay or failure of your condition to heal. SEEK IMMEDIATE MEDICAL CARE IF:   You have new, unexplained symptoms.  Your symptoms get worse and are not helped or controlled with medicines. MAKE SURE YOU:   Understand these instructions.  Will watch your condition.  Will get help right away if you are not doing well or get worse. Document Released: 02/10/2000 Document Revised: 05/07/2011 Document Reviewed: 12/29/2010 [facility] Patient Information 2014 [place], [place].

## 2013-06-24 NOTE — ED Notes (Signed)
Pt c/o left wrist/carpal pain onset 3 days Pain radiates upward to elbow and increases w/activity Denies inj/trauma; does arts and crafts Alert w/no signs of acute distress.

## 2013-06-29 ENCOUNTER — Ambulatory Visit (INDEPENDENT_AMBULATORY_CARE_PROVIDER_SITE_OTHER): Payer: Medicaid Other | Admitting: Surgery

## 2013-06-29 ENCOUNTER — Encounter (INDEPENDENT_AMBULATORY_CARE_PROVIDER_SITE_OTHER): Payer: Self-pay | Admitting: Surgery

## 2013-06-29 VITALS — BP 127/86 | HR 74 | Temp 97.7°F | Resp 18 | Ht 63.0 in | Wt 201.8 lb

## 2013-06-29 DIAGNOSIS — N63 Unspecified lump in unspecified breast: Secondary | ICD-10-CM

## 2013-06-29 DIAGNOSIS — N6310 Unspecified lump in the right breast, unspecified quadrant: Secondary | ICD-10-CM | POA: Insufficient documentation

## 2013-06-29 NOTE — Progress Notes (Signed)
Patient ID: Julia Newman, female   DOB: 1975/03/17, 38 y.o.   MRN: [id number]  Chief Complaint  Patient presents with  . Breast Problem    HPI Julia Newman is a 38 y.o. female.  Referred by Dr. Shon Hale for evaluation of right intraductal papilloma HPI This is a healthy 38 year old female with no significant history for breast cancer who presents with recent onset of a palpable mass behind her right nipple at 12:00. She underwent mammogram and ultrasound at breast center [facility]. She had a needle biopsy of this palpable mass which showed intraductal papilloma. She is now referred for surgical evaluation.  The patient had significant bleeding and hematoma formation after her needle biopsy.  Past Medical History  Diagnosis Date  . Hx MRSA infection     on face and upper thigh  . Anemia   . Arthritis   Anxiety Depression Schizoaffective disorder Uterine polyps  Past Surgical History  Procedure Laterality Date  . Finger surgery    Neck surgery   History reviewed. No pertinent family history. Grandmother - colon cancer  Social History History  Substance Use Topics  . Smoking status: Current Some Day Smoker    Types: Cigarettes  . Smokeless tobacco: Not on file  . Alcohol Use: Yes     Comment: Social    No Known Allergies  Current Outpatient Prescriptions  Medication Sig Dispense Refill  . ALPRAZolam (XANAX PO) Take by mouth.      . Divalproex Sodium (DEPAKOTE PO) Take by mouth.      . FLUOXETINE HCL PO Take by mouth.      Marland Kitchen ibuprofen (ADVIL,MOTRIN) 200 MG tablet Take 200-400 mg by mouth every 6 (six) hours as needed for pain.      . MELOXICAM PO Take by mouth.      Marland Kitchen PARoxetine HCl (PAXIL PO) Take by mouth.      . PredniSONE 10 MG KIT 12 day dose pack po  1 kit  0   No current facility-administered medications for this visit.    Review of Systems Review of Systems  Constitutional: Negative for fever, chills and unexpected weight change.  HENT:  Negative for congestion, hearing loss, sore throat, trouble swallowing and voice change.   Eyes: Negative for visual disturbance.  Respiratory: Negative for cough and wheezing.   Cardiovascular: Negative for chest pain, palpitations and leg swelling.  Gastrointestinal: Negative for nausea, vomiting, abdominal pain, diarrhea, constipation, blood in stool, abdominal distention and anal bleeding.  Genitourinary: Negative for hematuria, vaginal bleeding and difficulty urinating.  Musculoskeletal: Negative for arthralgias.  Skin: Negative for rash and wound.  Neurological: Negative for seizures, syncope and headaches.  Hematological: Negative for adenopathy. Does not bruise/bleed easily.  Psychiatric/Behavioral: Negative for confusion.    Blood pressure 127/86, pulse 74, temperature 97.7 F (36.5 C), temperature source Temporal, resp. rate 18, height 5' 3" (1.6 m), weight 201 lb 12.8 oz (91.536 kg), last menstrual period 06/09/2013.  Physical Exam Physical Exam WDWN in NAD HEENT:  EOMI, sclera anicteric Neck:  No masses, no thyromegaly Lungs:  CTA bilaterally; normal respiratory effort Right breast - 1 cm palpable mass in retroareolar region at 12:00; 3 cm hematoma surrounding this area.  No other dominant masses in either breast; no axillary lymphadenopathy CV:  Regular rate and rhythm; no murmurs Abd:  +bowel sounds, soft, non-tender, no masses Ext:  Well-perfused; no edema Skin:  Warm, dry; no sign of jaundice  Data Reviewed   Mm Digital Diagnostic Unilat  R  06/11/2013   CLINICAL DATA:  Status post ultrasound-guided core biopsy of intraductal mass in the 12 o'clock location of the right breast.  EXAM: POST-BIOPSY CLIP PLACEMENT RIGHT DIAGNOSTIC MAMMOGRAM  COMPARISON:  Previous exams.  FINDINGS: Films are performed following ultrasound guided biopsy of mass in the 12 o'clock location of the right breast. A ribbon shaped clip is identified in the 12 o'clock location as expected.   IMPRESSION: Tissue marker clip is in expected location following biopsy.  Final Assessment: Post Procedure Mammograms for Marker Placement   Electronically Signed   By: Shon Hale M.D.   On: 06/11/2013 14:29   US Breast Ltd Uni Left Inc Axilla  06/11/2013   CLINICAL DATA:  The patient returns for further evaluation of the left breast. On the patient's recent physical exam, multiple palpable abnormalities were noted in the lateral aspect of the left breast.  EXAM: ULTRASOUND left BREAST  COMPARISON:  05/29/2013  ACR Breast Density Category c: The breast tissue is heterogeneously dense, which may obscure small masses.  FINDINGS: Spot tangential view is performed of the left breast, showing no discrete abnormality to correspond with the area of palpable findings.  On physical exam, I palpate no discrete mass in the lateral aspect of the left breast.  Ultrasound is performed, showing normal appearing fibroglandular tissue throughout the lateral quadrants of the left breast. No mass, distortion, or acoustic shadowing is demonstrated with ultrasound.  IMPRESSION: 1.  No mammographic or ultrasound evidence for malignancy. 2. No mammographic or sonographic abnormalities in the lateral aspect of the left breast.  RECOMMENDATION: 1. Screening mammogram is suggested in 1 year. 2. Patient is having right breast biopsy today. This is dictated separately.  I have discussed the findings and recommendations with the patient. Results were also provided in writing at the conclusion of the visit. If applicable, a reminder letter will be sent to the patient regarding the next appointment.  BI-RADS CATEGORY  1: Negative.   Electronically Signed   By: Shon Hale M.D.   On: 06/11/2013 14:29   [place] Rt Breast Bx W Loc Dev 1st Lesion Img Bx Spec US Guide  06/12/2013   ADDENDUM REPORT: 06/12/2013 09:20  ADDENDUM: Pathology demonstrates an intraductal papilloma. This concordant. Surgical excision is recommended. The patient has an  appointment with Dr. Georgette Dover on 06/29/2013 at 9 o'clock. Results and recommendations were discussed with the patient at 9 a.m. on 06/12/2013. The patient reports no complaints regarding her biopsy site.   Electronically Signed   By: Donavan Burnet M.D.   On: 06/12/2013 09:20   06/12/2013   CLINICAL DATA:  The patient presents for ultrasound-guided core biopsy of intraductal mass in the right breast.  EXAM: ULTRASOUND GUIDED right BREAST CORE NEEDLE BIOPSY WITH VACUUM ASSIST  COMPARISON:  Previous exams.  PROCEDURE: I met with the patient and we discussed the procedure of ultrasound-guided biopsy, including benefits and alternatives. We discussed the high likelihood of a successful procedure. We discussed the risks of the procedure including infection, bleeding, tissue injury, clip migration, and inadequate sampling. Informed written consent was given. The usual time-out protocol was performed immediately prior to the procedure.  Using sterile technique and 2% Lidocaine as local anesthetic, under direct ultrasound visualization, a 12 gauge vacuum-assisteddevice was used to perform biopsy of intraductal mass the 12 o'clock location of the right breast using a medial approach. At the conclusion of the procedure, a ribbon shaped tissue marker clip was deployed into the biopsy cavity. Follow-up 2-view  mammogram was performed and dictated separately.  IMPRESSION: Ultrasound-guided biopsy of right breast mass. No apparent complications.  Electronically Signed: By: Shon Hale M.D. 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  The surgical procedure has been discussed with the patient.  Potential risks, benefits, alternative treatments, and expected outcomes have been explained.  All of the patient's questions at this time have been answered.  The likelihood of reaching the patient's treatment goal is good.   The patient understand the proposed surgical procedure and wishes to proceed.         Imogene Burn. Gracious Renken 06/29/2013, 10:44 AM

## 2013-07-27 DIAGNOSIS — D241 Benign neoplasm of right breast: Secondary | ICD-10-CM

## 2013-07-27 HISTORY — DX: Benign neoplasm of right breast: D24.1

## 2013-07-30 ENCOUNTER — Encounter (HOSPITAL_BASED_OUTPATIENT_CLINIC_OR_DEPARTMENT_OTHER): Payer: Self-pay | Admitting: *Deleted

## 2013-08-05 ENCOUNTER — Telehealth (INDEPENDENT_AMBULATORY_CARE_PROVIDER_SITE_OTHER): Payer: Self-pay

## 2013-08-05 NOTE — Telephone Encounter (Signed)
Pt is scheduled for a right breast excisional biopsy tomorrow. Pt states that she no longer feels the mass. Pt wanted to make sure that she still needed to have the procedure. Informed pt that Dr Georgette Dover was out of the office until tomorrow, however I would speak with one of his partners. Spoke with Dr Excell Seltzer he recommended that she still have the procedure in the am and to have pt make Dr Georgette Dover aware. Informed pt of Dr Hoxworth's recommendations. Pt verbalized understanding.

## 2013-08-06 ENCOUNTER — Encounter (HOSPITAL_BASED_OUTPATIENT_CLINIC_OR_DEPARTMENT_OTHER)
Admission: RE | Disposition: A | Discharge status: Home / Self Care | Payer: Self-pay | Source: Ambulatory Visit | Attending: Surgery

## 2013-08-06 ENCOUNTER — Ambulatory Visit (HOSPITAL_BASED_OUTPATIENT_CLINIC_OR_DEPARTMENT_OTHER): Payer: Medicaid Other | Admitting: Anesthesiology

## 2013-08-06 ENCOUNTER — Encounter (HOSPITAL_BASED_OUTPATIENT_CLINIC_OR_DEPARTMENT_OTHER): Payer: Self-pay | Admitting: Anesthesiology

## 2013-08-06 ENCOUNTER — Encounter (HOSPITAL_BASED_OUTPATIENT_CLINIC_OR_DEPARTMENT_OTHER): Payer: Medicaid Other | Admitting: Anesthesiology

## 2013-08-06 ENCOUNTER — Ambulatory Visit (HOSPITAL_BASED_OUTPATIENT_CLINIC_OR_DEPARTMENT_OTHER)
Admission: RE | Admit: 2013-08-06 | Discharge: 2013-08-06 | Disposition: A | Discharge status: Home / Self Care | Payer: Medicaid Other | Source: Ambulatory Visit | Attending: Surgery | Admitting: Surgery

## 2013-08-06 DIAGNOSIS — F17200 Nicotine dependence, unspecified, uncomplicated: Secondary | ICD-10-CM | POA: Insufficient documentation

## 2013-08-06 DIAGNOSIS — D249 Benign neoplasm of unspecified breast: Secondary | ICD-10-CM | POA: Insufficient documentation

## 2013-08-06 DIAGNOSIS — F329 Major depressive disorder, single episode, unspecified: Secondary | ICD-10-CM | POA: Insufficient documentation

## 2013-08-06 DIAGNOSIS — N6019 Diffuse cystic mastopathy of unspecified breast: Secondary | ICD-10-CM

## 2013-08-06 DIAGNOSIS — F3289 Other specified depressive episodes: Secondary | ICD-10-CM | POA: Insufficient documentation

## 2013-08-06 DIAGNOSIS — Z79899 Other long term (current) drug therapy: Secondary | ICD-10-CM | POA: Insufficient documentation

## 2013-08-06 DIAGNOSIS — F259 Schizoaffective disorder, unspecified: Secondary | ICD-10-CM | POA: Insufficient documentation

## 2013-08-06 DIAGNOSIS — N6310 Unspecified lump in the right breast, unspecified quadrant: Secondary | ICD-10-CM

## 2013-08-06 DIAGNOSIS — F411 Generalized anxiety disorder: Secondary | ICD-10-CM | POA: Insufficient documentation

## 2013-08-06 HISTORY — DX: Panic disorder (episodic paroxysmal anxiety): F41.0

## 2013-08-06 HISTORY — DX: Depression, unspecified: F32.A

## 2013-08-06 HISTORY — DX: Major depressive disorder, single episode, unspecified: F32.9

## 2013-08-06 HISTORY — PX: BREAST BIOPSY: SHX20

## 2013-08-06 HISTORY — DX: Benign neoplasm of right breast: D24.1

## 2013-08-06 LAB — POCT HEMOGLOBIN-HEMACUE: HEMOGLOBIN: 11.9 g/dL — AB (ref 12.0–15.0)

## 2013-08-06 SURGERY — BREAST BIOPSY
Anesthesia: General | Site: Breast | Laterality: Right

## 2013-08-06 MED ORDER — FENTANYL CITRATE 0.05 MG/ML IJ SOLN
INTRAMUSCULAR | Status: AC
  Filled 2013-08-06: qty 4

## 2013-08-06 MED ORDER — CEFAZOLIN SODIUM-DEXTROSE 2-3 GM-% IV SOLR
2.0000 g | INTRAVENOUS | Status: AC
Start: 2013-08-06 — End: 2013-08-06
  Administered 2013-08-06: 2 g via INTRAVENOUS

## 2013-08-06 MED ORDER — ONDANSETRON HCL 4 MG/2ML IJ SOLN: 4.0000 mg | INTRAMUSCULAR | Status: DC | PRN

## 2013-08-06 MED ORDER — HYDROMORPHONE HCL PF 1 MG/ML IJ SOLN: 0.2500 mg | INTRAMUSCULAR | Status: DC | PRN

## 2013-08-06 MED ORDER — HYDROCODONE-ACETAMINOPHEN 5-325 MG PO TABS: 1.0000 | ORAL_TABLET | ORAL | Status: DC | PRN

## 2013-08-06 MED ORDER — METOCLOPRAMIDE HCL 5 MG/ML IJ SOLN: 10.0000 mg | Freq: Once | INTRAMUSCULAR | Status: DC | PRN

## 2013-08-06 MED ORDER — CEFAZOLIN SODIUM-DEXTROSE 2-3 GM-% IV SOLR
INTRAVENOUS | Status: AC
  Filled 2013-08-06: qty 50

## 2013-08-06 MED ORDER — MIDAZOLAM HCL 2 MG/2ML IJ SOLN: 1.0000 mg | INTRAMUSCULAR | Status: DC | PRN

## 2013-08-06 MED ORDER — MIDAZOLAM HCL 2 MG/2ML IJ SOLN
INTRAMUSCULAR | Status: AC
  Filled 2013-08-06: qty 2

## 2013-08-06 MED ORDER — BUPIVACAINE-EPINEPHRINE 0.25% -1:200000 IJ SOLN
INTRAMUSCULAR | Status: DC | PRN
  Administered 2013-08-06: 7.5 mL

## 2013-08-06 MED ORDER — MIDAZOLAM HCL 2 MG/ML PO SYRP: 12.0000 mg | ORAL_SOLUTION | Freq: Once | ORAL | Status: DC | PRN

## 2013-08-06 MED ORDER — FENTANYL CITRATE 0.05 MG/ML IJ SOLN: 50.0000 ug | INTRAMUSCULAR | Status: DC | PRN

## 2013-08-06 MED ORDER — PROPOFOL 10 MG/ML IV BOLUS
INTRAVENOUS | Status: DC | PRN
  Administered 2013-08-06: 180 mg via INTRAVENOUS

## 2013-08-06 MED ORDER — CHLORHEXIDINE GLUCONATE 4 % EX LIQD: 1.0000 "application " | Freq: Once | CUTANEOUS | Status: DC

## 2013-08-06 MED ORDER — MORPHINE SULFATE 2 MG/ML IJ SOLN: 2.0000 mg | INTRAMUSCULAR | Status: DC | PRN

## 2013-08-06 MED ORDER — ONDANSETRON HCL 4 MG/2ML IJ SOLN
INTRAMUSCULAR | Status: DC | PRN
  Administered 2013-08-06: 4 mg via INTRAVENOUS

## 2013-08-06 MED ORDER — BUPIVACAINE-EPINEPHRINE (PF) 0.25% -1:200000 IJ SOLN
INTRAMUSCULAR | Status: AC
  Filled 2013-08-06: qty 30

## 2013-08-06 MED ORDER — LACTATED RINGERS IV SOLN
INTRAVENOUS | Status: DC
  Administered 2013-08-06 (×2): via INTRAVENOUS

## 2013-08-06 MED ORDER — FENTANYL CITRATE 0.05 MG/ML IJ SOLN
INTRAMUSCULAR | Status: DC | PRN
  Administered 2013-08-06: 100 ug via INTRAVENOUS
  Administered 2013-08-06: 60 ug via INTRAVENOUS

## 2013-08-06 MED ORDER — OXYCODONE HCL 5 MG PO TABS: 5.0000 mg | ORAL_TABLET | Freq: Once | ORAL | Status: DC | PRN

## 2013-08-06 MED ORDER — DEXAMETHASONE SODIUM PHOSPHATE 4 MG/ML IJ SOLN
INTRAMUSCULAR | Status: DC | PRN
  Administered 2013-08-06: 10 mg via INTRAVENOUS

## 2013-08-06 MED ORDER — OXYCODONE HCL 5 MG/5ML PO SOLN: 5.0000 mg | Freq: Once | ORAL | Status: DC | PRN

## 2013-08-06 MED ORDER — METOCLOPRAMIDE HCL 5 MG/ML IJ SOLN
INTRAMUSCULAR | Status: DC | PRN
  Administered 2013-08-06: 10 mg via INTRAVENOUS

## 2013-08-06 MED ORDER — MIDAZOLAM HCL 5 MG/5ML IJ SOLN
INTRAMUSCULAR | Status: DC | PRN
  Administered 2013-08-06: 2 mg via INTRAVENOUS

## 2013-08-06 SURGICAL SUPPLY — 47 items
APL SKNCLS STERI-STRIP NONHPOA (GAUZE/BANDAGES/DRESSINGS) ×1
APPLICATOR COTTON TIP 6IN STRL (MISCELLANEOUS) IMPLANT
BENZOIN TINCTURE PRP APPL 2/3 (GAUZE/BANDAGES/DRESSINGS) ×3 IMPLANT
BLADE HEX COATED 2.75 (ELECTRODE) ×3 IMPLANT
BLADE SURG 15 STRL LF DISP TIS (BLADE) ×1 IMPLANT
BLADE SURG 15 STRL SS (BLADE) ×3
CANISTER SUCT 1200ML W/VALVE (MISCELLANEOUS) IMPLANT
CHLORAPREP W/TINT 26ML (MISCELLANEOUS) ×3 IMPLANT
CLOSURE WOUND 1/2 X4 (GAUZE/BANDAGES/DRESSINGS) ×1
COVER MAYO STAND STRL (DRAPES) ×3 IMPLANT
COVER TABLE BACK 60X90 (DRAPES) ×3 IMPLANT
DECANTER SPIKE VIAL GLASS SM (MISCELLANEOUS) ×3 IMPLANT
DEVICE DUBIN W/COMP PLATE 8390 (MISCELLANEOUS) IMPLANT
DRAPE PED LAPAROTOMY (DRAPES) ×3 IMPLANT
DRAPE UTILITY XL STRL (DRAPES) ×3 IMPLANT
DRSG TEGADERM 4X4.75 (GAUZE/BANDAGES/DRESSINGS) ×3 IMPLANT
ELECT REM PT RETURN 9FT ADLT (ELECTROSURGICAL) ×3
ELECTRODE REM PT RTRN 9FT ADLT (ELECTROSURGICAL) ×1 IMPLANT
GLOVE BIO SURGEON STRL SZ7 (GLOVE) ×3 IMPLANT
GLOVE BIO SURGEON STRL SZ7.5 (GLOVE) ×2 IMPLANT
GLOVE BIOGEL PI IND STRL 6.5 (GLOVE) IMPLANT
GLOVE BIOGEL PI IND STRL 7.5 (GLOVE) ×1 IMPLANT
GLOVE BIOGEL PI INDICATOR 6.5 (GLOVE) ×2
GLOVE BIOGEL PI INDICATOR 7.5 (GLOVE) ×4
GLOVE ECLIPSE 6.5 STRL STRAW (GLOVE) ×2 IMPLANT
GOWN STRL REUS W/ TWL LRG LVL3 (GOWN DISPOSABLE) ×1 IMPLANT
GOWN STRL REUS W/TWL LRG LVL3 (GOWN DISPOSABLE) ×3
KIT MARKER MARGIN INK (KITS) IMPLANT
NEEDLE HYPO 25X1 1.5 SAFETY (NEEDLE) ×3 IMPLANT
NS IRRIG 1000ML POUR BTL (IV SOLUTION) IMPLANT
PACK BASIN DAY SURGERY FS (CUSTOM PROCEDURE TRAY) ×3 IMPLANT
PENCIL BUTTON HOLSTER BLD 10FT (ELECTRODE) ×3 IMPLANT
SLEEVE SCD COMPRESS KNEE MED (MISCELLANEOUS) ×3 IMPLANT
SPONGE GAUZE 4X4 12PLY STER LF (GAUZE/BANDAGES/DRESSINGS) IMPLANT
SPONGE LAP 4X18 X RAY DECT (DISPOSABLE) ×3 IMPLANT
STRIP CLOSURE SKIN 1/2X4 (GAUZE/BANDAGES/DRESSINGS) ×2 IMPLANT
SUT CHROMIC 3 0 SH 27 (SUTURE) IMPLANT
SUT MON AB 4-0 PC3 18 (SUTURE) ×6 IMPLANT
SUT SILK 2 0 SH (SUTURE) IMPLANT
SUT VIC AB 3-0 SH 27 (SUTURE) ×6
SUT VIC AB 3-0 SH 27X BRD (SUTURE) ×1 IMPLANT
SYR CONTROL 10ML LL (SYRINGE) ×3 IMPLANT
TOWEL OR 17X24 6PK STRL BLUE (TOWEL DISPOSABLE) ×4 IMPLANT
TOWEL OR NON WOVEN STRL DISP B (DISPOSABLE) ×3 IMPLANT
TUBE CONNECTING 20'X1/4 (TUBING) ×1
TUBE CONNECTING 20X1/4 (TUBING) ×1 IMPLANT
YANKAUER SUCT BULB TIP NO VENT (SUCTIONS) ×2 IMPLANT

## 2013-08-06 NOTE — Discharge Instructions (Signed)
[facility] °Office Phone Number [phone] ° °BREAST BIOPSY/ PARTIAL MASTECTOMY: POST OP INSTRUCTIONS ° °Always review your discharge instruction sheet given to you by the facility where your surgery was performed. ° °IF YOU HAVE DISABILITY OR FAMILY LEAVE FORMS, YOU MUST BRING THEM TO THE OFFICE FOR PROCESSING.  DO NOT GIVE THEM TO YOUR DOCTOR. ° °1. A prescription for pain medication may be given to you upon discharge.  Take your pain medication as prescribed, if needed.  If narcotic pain medicine is not needed, then you may take acetaminophen (Tylenol) or ibuprofen (Advil) as needed. °2. Take your usually prescribed medications unless otherwise directed °3. If you need a refill on your pain medication, please contact your pharmacy.  They will contact our office to request authorization.  Prescriptions will not be filled after 5pm or on week-ends. °4. You should eat very light the first 24 hours after surgery, such as soup, crackers, pudding, etc.  Resume your normal diet the day after surgery. °5. Most patients will experience some swelling and bruising in the breast.  Ice packs and a good support bra will help.  Swelling and bruising can take several days to resolve.  °6. It is common to experience some constipation if taking pain medication after surgery.  Increasing fluid intake and taking a stool softener will usually help or prevent this problem from occurring.  A mild laxative (Milk of Magnesia or Miralax) should be taken according to package directions if there are no bowel movements after 48 hours. °7. Unless discharge instructions indicate otherwise, you may remove your bandages 48 hours after surgery, and you may shower at that time.  You will have steri-strips (small skin tapes) in place directly over the incision.  These strips should be left on the skin for 7-10 days.   Any sutures or staples will be removed at the office during your follow-up visit. °8. ACTIVITIES:  You may resume  regular daily activities (gradually increasing) beginning the next day.  Wearing a good support bra or sports bra minimizes pain and swelling.  You may have sexual intercourse when it is comfortable. °a. You may drive when you no longer are taking prescription pain medication, you can comfortably wear a seatbelt, and you can safely maneuver your car and apply brakes. °b. RETURN TO WORK:  1-2 weeks °9. You should see your doctor in the office for a follow-up appointment approximately two weeks after your surgery.  Your doctor’s nurse will typically make your follow-up appointment when she calls you with your pathology report.  Expect your pathology report 2-3 business days after your surgery.  You may call to check if you do not hear from us after three days. °10. OTHER INSTRUCTIONS: _______________________________________________________________________________________________ _____________________________________________________________________________________________________________________________________ °_____________________________________________________________________________________________________________________________________ °_____________________________________________________________________________________________________________________________________ ° °WHEN TO CALL YOUR DOCTOR: °1. Fever over 101.0 °2. Nausea and/or vomiting. °3. Extreme swelling or bruising. °4. Continued bleeding from incision. °5. Increased pain, redness, or drainage from the incision. ° °The clinic staff is available to answer your questions during regular business hours.  Please don’t hesitate to call and ask to speak to one of the nurses for clinical concerns.  If you have a medical emergency, go to the nearest emergency room or call 911.  A surgeon from [facility] is always on call at the hospital. ° °For further questions, please visit [email or web address]  ° ° °Post Anesthesia Home Care  Instructions ° °Activity: °Get plenty of rest for the remainder of the day. A responsible adult should stay with   you for 24 hours following the procedure.  °For the next 24 hours, DO NOT: °-Drive a car °-Operate machinery °-Drink alcoholic beverages °-Take any medication unless instructed by your physician °-Make any legal decisions or sign important papers. ° °Meals: °Start with liquid foods such as gelatin or soup. Progress to regular foods as tolerated. Avoid greasy, spicy, heavy foods. If nausea and/or vomiting occur, drink only clear liquids until the nausea and/or vomiting subsides. Call your physician if vomiting continues. ° °Special Instructions/Symptoms: °Your throat may feel dry or sore from the anesthesia or the breathing tube placed in your throat during surgery. If this causes discomfort, gargle with warm salt water. The discomfort should disappear within 24 hours. ° °

## 2013-08-06 NOTE — Op Note (Signed)
Pre-op Diagnosis:  Right breast intraductal papilloma Postop diagnosis: Same Procedure performed: Right breast excisional biopsy Surgeon:Lorren Splawn K. Anesthesia: Gen. Via LMA Indications: This is a 38 year old female who presented with a palpable mass behind the areole at 12:00. This was biopsied and showed an intraductal papilloma. A biopsy clip was placed. She presents now for excisional biopsy to rule out underlying malignancy.  Description of procedure: The patient was brought to the operating room and placed in a supine position on the operating room table. After an adequate level of general anesthesia was obtained, her right breast was prepped with chlor prep and draped in sterile fashion. A timeout was taken to ensure the proper patient and proper procedure. We made a curvilinear circumareolar incision around the upper part of her nipple. Dissection was carried down into the subcutaneous tissues with cautery. The mass was palpated and grasped with an Allis clamp. I excised the palpable mass. We oriented this with the paint kit. Specimen mammogram showed that the biopsy clip was within the Center of the mass. This was sent for pathologic examination. We inspected for hemostasis. The wound was closed with 3-0 Vicryl and a 4-0 Monocryl. Steri-Strips and clean dressings were applied. The patient was then extubated and brought to the recovery room in stable condition. All sponge, instrument, and needle counts are correct.  Julia Newman. Julia Dover, MD, [facility]  General/ Trauma Surgery  08/06/2013 11:26 AM

## 2013-08-06 NOTE — H&P (Signed)
HPI  Julia Newman is a 38 y.o. female. Referred by Dr. Shon Hale for evaluation of right intraductal papilloma  HPI  This is a healthy 38 year old female with no significant history for breast cancer who presents with recent onset of a palpable mass behind her right nipple at 12:00. She underwent mammogram and ultrasound at breast center [facility]. She had a needle biopsy of this palpable mass which showed intraductal papilloma. She is now referred for surgical evaluation.  The patient had significant bleeding and hematoma formation after her needle biopsy.  Past Medical History   Diagnosis  Date   .  Hx MRSA infection      on face and upper thigh   .  Anemia    .  Arthritis    Anxiety  Depression  Schizoaffective disorder  Uterine polyps  Past Surgical History   Procedure  Laterality  Date   .  Finger surgery     Neck surgery  History reviewed. No pertinent family history.  Grandmother - colon cancer  Social History  History   Substance Use Topics   .  Smoking status:  Current Some Day Smoker     Types:  Cigarettes   .  Smokeless tobacco:  Not on file   .  Alcohol Use:  Yes      Comment: Social   No Known Allergies  Current Outpatient Prescriptions   Medication  Sig  Dispense  Refill   .  ALPRAZolam (XANAX PO)  Take by mouth.     .  Divalproex Sodium (DEPAKOTE PO)  Take by mouth.     .  FLUOXETINE HCL PO  Take by mouth.     Marland Kitchen  ibuprofen (ADVIL,MOTRIN) 200 MG tablet  Take 200-400 mg by mouth every 6 (six) hours as needed for pain.     .  MELOXICAM PO  Take by mouth.     Marland Kitchen  PARoxetine HCl (PAXIL PO)  Take by mouth.     .  PredniSONE 10 MG KIT  12 day dose pack po  1 kit  0    No current facility-administered medications for this visit.   Review of Systems  Review of Systems  Constitutional: Negative for fever, chills and unexpected weight change.  HENT: Negative for congestion, hearing loss, sore throat, trouble swallowing and voice change.  Eyes: Negative for  visual disturbance.  Respiratory: Negative for cough and wheezing.  Cardiovascular: Negative for chest pain, palpitations and leg swelling.  Gastrointestinal: Negative for nausea, vomiting, abdominal pain, diarrhea, constipation, blood in stool, abdominal distention and anal bleeding.  Genitourinary: Negative for hematuria, vaginal bleeding and difficulty urinating.  Musculoskeletal: Negative for arthralgias.  Skin: Negative for rash and wound.  Neurological: Negative for seizures, syncope and headaches.  Hematological: Negative for adenopathy. Does not bruise/bleed easily.  Psychiatric/Behavioral: Negative for confusion.  Blood pressure 127/86, pulse 74, temperature 97.7 F (36.5 C), temperature source Temporal, resp. rate 18, height $RemoveBe'5\' 3"'vYdbIOODm$  (1.6 m), weight 201 lb 12.8 oz (91.536 kg), last menstrual period 06/09/2013.  Physical Exam  Physical Exam  WDWN in NAD  HEENT: EOMI, sclera anicteric  Neck: No masses, no thyromegaly  Lungs: CTA bilaterally; normal respiratory effort  Right breast - 1 cm palpable mass in retroareolar region at 12:00; 3 cm hematoma surrounding this area. No other dominant masses in either breast; no axillary lymphadenopathy  CV: Regular rate and rhythm; no murmurs  Abd: +bowel sounds, soft, non-tender, no masses  Ext: Well-perfused; no edema  Skin: Warm, dry; no sign of jaundice  Data Reviewed  Mm Digital Diagnostic Unilat R  06/11/2013 CLINICAL DATA: Status post ultrasound-guided core biopsy of intraductal mass in the 12 o'clock location of the right breast. EXAM: POST-BIOPSY CLIP PLACEMENT RIGHT DIAGNOSTIC MAMMOGRAM COMPARISON: Previous exams. FINDINGS: Films are performed following ultrasound guided biopsy of mass in the 12 o'clock location of the right breast. A ribbon shaped clip is identified in the 12 o'clock location as expected. IMPRESSION: Tissue marker clip is in expected location following biopsy. Final Assessment: Post Procedure Mammograms for Marker  Placement Electronically Signed By: Shon Hale M.D. On: 06/11/2013 14:29  US Breast Ltd Uni Left Inc Axilla  06/11/2013 CLINICAL DATA: The patient returns for further evaluation of the left breast. On the patient's recent physical exam, multiple palpable abnormalities were noted in the lateral aspect of the left breast. EXAM: ULTRASOUND left BREAST COMPARISON: 05/29/2013 ACR Breast Density Category c: The breast tissue is heterogeneously dense, which may obscure small masses. FINDINGS: Spot tangential view is performed of the left breast, showing no discrete abnormality to correspond with the area of palpable findings. On physical exam, I palpate no discrete mass in the lateral aspect of the left breast. Ultrasound is performed, showing normal appearing fibroglandular tissue throughout the lateral quadrants of the left breast. No mass, distortion, or acoustic shadowing is demonstrated with ultrasound. IMPRESSION: 1. No mammographic or ultrasound evidence for malignancy. 2. No mammographic or sonographic abnormalities in the lateral aspect of the left breast. RECOMMENDATION: 1. Screening mammogram is suggested in 1 year. 2. Patient is having right breast biopsy today. This is dictated separately. I have discussed the findings and recommendations with the patient. Results were also provided in writing at the conclusion of the visit. If applicable, a reminder letter will be sent to the patient regarding the next appointment. BI-RADS CATEGORY 1: Negative. Electronically Signed By: Shon Hale M.D. On: 06/11/2013 14:29  [place] Rt Breast Bx W Loc Dev 1st Lesion Img Bx Spec US Guide  06/12/2013 ADDENDUM REPORT: 06/12/2013 09:20 ADDENDUM: Pathology demonstrates an intraductal papilloma. This concordant. Surgical excision is recommended. The patient has an appointment with Dr. Georgette Dover on 06/29/2013 at 9 o'clock. Results and recommendations were discussed with the patient at 9 a.m. on 06/12/2013. The patient reports no complaints  regarding her biopsy site. Electronically Signed By: Donavan Burnet M.D. On: 06/12/2013 09:20  06/12/2013 CLINICAL DATA: The patient presents for ultrasound-guided core biopsy of intraductal mass in the right breast. EXAM: ULTRASOUND GUIDED right BREAST CORE NEEDLE BIOPSY WITH VACUUM ASSIST COMPARISON: Previous exams. PROCEDURE: I met with the patient and we discussed the procedure of ultrasound-guided biopsy, including benefits and alternatives. We discussed the high likelihood of a successful procedure. We discussed the risks of the procedure including infection, bleeding, tissue injury, clip migration, and inadequate sampling. Informed written consent was given. The usual time-out protocol was performed immediately prior to the procedure. Using sterile technique and 2% Lidocaine as local anesthetic, under direct ultrasound visualization, a 12 gauge vacuum-assisteddevice was used to perform biopsy of intraductal mass the 12 o'clock location of the right breast using a medial approach. At the conclusion of the procedure, a ribbon shaped tissue marker clip was deployed into the biopsy cavity. Follow-up 2-view mammogram was performed and dictated separately. IMPRESSION: Ultrasound-guided biopsy of right breast mass. No apparent complications. Electronically Signed: By: Shon Hale M.D. 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 The surgical  procedure has been discussed with the patient. Potential risks, benefits, alternative treatments, and expected outcomes have been explained. All of the patient's questions at this time have been answered. The likelihood of reaching the patient's treatment goal is good. The patient understand the proposed surgical procedure and wishes to proceed.    Imogene Burn. Georgette Dover, MD, [facility]  General/ Trauma Surgery  08/06/2013 10:24 AM

## 2013-08-06 NOTE — Anesthesia Postprocedure Evaluation (Signed)
  Anesthesia Post-op Note  Patient: Julia Newman  Procedure(s) Performed: Procedure(s): Right breast excisional biopsy (Right)  Patient Location: PACU  Anesthesia Type:General  Level of Consciousness: awake, alert  and oriented  Airway and Oxygen Therapy: Patient Spontanous Breathing  Post-op Pain: none  Post-op Assessment: Post-op Vital signs reviewed  Post-op Vital Signs: Reviewed  Last Vitals:  Filed Vitals:   08/06/13 1257  BP: 110/64  Pulse: 59  Temp:   Resp: 12    Complications: No apparent anesthesia complications

## 2013-08-06 NOTE — Anesthesia Procedure Notes (Signed)
Procedure Name: LMA Insertion Date/Time: 08/06/2013 10:53 AM Performed by: Maryella Shivers Pre-anesthesia Checklist: Patient identified, Emergency Drugs available, Suction available and Patient being monitored Patient Re-evaluated:Patient Re-evaluated prior to inductionOxygen Delivery Method: Circle System Utilized Preoxygenation: Pre-oxygenation with 100% oxygen Intubation Type: IV induction Ventilation: Mask ventilation without difficulty LMA: LMA inserted LMA Size: 4.0 Number of attempts: 1 Airway Equipment and Method: bite block Placement Confirmation: positive ETCO2 Tube secured with: Tape Dental Injury: Teeth and Oropharynx as per pre-operative assessment

## 2013-08-06 NOTE — Anesthesia Preprocedure Evaluation (Signed)
Anesthesia Evaluation  Patient identified by MRN, date of birth, ID band Patient awake    Reviewed: Allergy & Precautions, H&P , NPO status , Patient's Chart, lab work & pertinent test results, reviewed documented beta blocker date and time   Airway Mallampati: II TM Distance: >3 FB Neck ROM: full    Dental   Pulmonary Current Smoker,  breath sounds clear to auscultation        Cardiovascular negative cardio ROS  Rhythm:regular     Neuro/Psych PSYCHIATRIC DISORDERS negative neurological ROS     GI/Hepatic negative GI ROS, Neg liver ROS, (+)     substance abuse  marijuana use,   Endo/Other  negative endocrine ROS  Renal/GU negative Renal ROS  negative genitourinary   Musculoskeletal   Abdominal   Peds  Hematology negative hematology ROS (+)   Anesthesia Other Findings See surgeon's H&P   Reproductive/Obstetrics negative OB ROS                           Anesthesia Physical Anesthesia Plan  ASA: II  Anesthesia Plan: General   Post-op Pain Management:    Induction: Intravenous  Airway Management Planned: LMA  Additional Equipment:   Intra-op Plan:   Post-operative Plan:   Informed Consent: I have reviewed the patients History and Physical, chart, labs and discussed the procedure including the risks, benefits and alternatives for the proposed anesthesia with the patient or authorized representative who has indicated his/her understanding and acceptance.   Dental Advisory Given  Plan Discussed with: CRNA and Surgeon  Anesthesia Plan Comments:         Anesthesia Quick Evaluation

## 2013-08-06 NOTE — Transfer of Care (Signed)
Immediate Anesthesia Transfer of Care Note  Patient: Julia Newman  Procedure(s) Performed: Procedure(s): Right breast excisional biopsy (Right)  Patient Location: PACU  Anesthesia Type:General  Level of Consciousness: sedated  Airway & Oxygen Therapy: Patient Spontanous Breathing and Patient connected to face mask oxygen  Post-op Assessment: Report given to PACU RN and Post -op Vital signs reviewed and stable  Post vital signs: Reviewed and stable  Complications: No apparent anesthesia complications

## 2013-08-10 ENCOUNTER — Telehealth (INDEPENDENT_AMBULATORY_CARE_PROVIDER_SITE_OTHER): Payer: Self-pay | Admitting: General Surgery

## 2013-08-10 ENCOUNTER — Encounter (HOSPITAL_BASED_OUTPATIENT_CLINIC_OR_DEPARTMENT_OTHER): Payer: Self-pay | Admitting: Surgery

## 2013-08-10 NOTE — Telephone Encounter (Signed)
Called patient to let her know that her path report came back normal, no sign of cancer. And I made her an apt to come back to see Dr Georgette Dover on 08-26-13 and I mailed her out an apt card

## 2013-08-26 ENCOUNTER — Encounter (INDEPENDENT_AMBULATORY_CARE_PROVIDER_SITE_OTHER): Payer: Self-pay | Admitting: Surgery

## 2013-08-26 ENCOUNTER — Ambulatory Visit (INDEPENDENT_AMBULATORY_CARE_PROVIDER_SITE_OTHER): Payer: Medicaid Other | Admitting: Surgery

## 2013-08-26 VITALS — BP 129/84 | HR 80 | Temp 98.1°F | Resp 16 | Ht 63.0 in | Wt 200.6 lb

## 2013-08-26 DIAGNOSIS — N63 Unspecified lump in unspecified breast: Secondary | ICD-10-CM

## 2013-08-26 DIAGNOSIS — N6310 Unspecified lump in the right breast, unspecified quadrant: Secondary | ICD-10-CM

## 2013-08-26 NOTE — Progress Notes (Signed)
Status post right excisional breast biopsy on 08/06/13 for an intraductal papilloma. Patient doing well. Her incision is healed. No sign of infection or seroma. She may resume full duty. Continue annual mammograms. Followup when necessary.  Imogene Burn. Georgette Dover, MD, [facility]  General/ Trauma Surgery  08/26/2013 10:08 AM

## 2013-11-23 ENCOUNTER — Emergency Department (HOSPITAL_COMMUNITY): Payer: Medicaid Other

## 2013-11-23 ENCOUNTER — Encounter (HOSPITAL_COMMUNITY): Payer: Self-pay | Admitting: Emergency Medicine

## 2013-11-23 ENCOUNTER — Emergency Department (HOSPITAL_COMMUNITY)
Admission: EM | Admit: 2013-11-23 | Discharge: 2013-11-23 | Disposition: A | Discharge status: Home / Self Care | Payer: Medicaid Other | Attending: Emergency Medicine | Admitting: Emergency Medicine

## 2013-11-23 DIAGNOSIS — F410 Panic disorder [episodic paroxysmal anxiety] without agoraphobia: Secondary | ICD-10-CM | POA: Diagnosis not present

## 2013-11-23 DIAGNOSIS — Z79899 Other long term (current) drug therapy: Secondary | ICD-10-CM | POA: Diagnosis not present

## 2013-11-23 DIAGNOSIS — S8991XA Unspecified injury of right lower leg, initial encounter: Secondary | ICD-10-CM

## 2013-11-23 DIAGNOSIS — Z8614 Personal history of Methicillin resistant Staphylococcus aureus infection: Secondary | ICD-10-CM | POA: Diagnosis not present

## 2013-11-23 DIAGNOSIS — F17200 Nicotine dependence, unspecified, uncomplicated: Secondary | ICD-10-CM | POA: Diagnosis not present

## 2013-11-23 DIAGNOSIS — Z8742 Personal history of other diseases of the female genital tract: Secondary | ICD-10-CM | POA: Insufficient documentation

## 2013-11-23 DIAGNOSIS — S99919A Unspecified injury of unspecified ankle, initial encounter: Secondary | ICD-10-CM | POA: Diagnosis not present

## 2013-11-23 DIAGNOSIS — F3289 Other specified depressive episodes: Secondary | ICD-10-CM | POA: Insufficient documentation

## 2013-11-23 DIAGNOSIS — F329 Major depressive disorder, single episode, unspecified: Secondary | ICD-10-CM | POA: Diagnosis not present

## 2013-11-23 DIAGNOSIS — Y9229 Other specified public building as the place of occurrence of the external cause: Secondary | ICD-10-CM | POA: Insufficient documentation

## 2013-11-23 DIAGNOSIS — S99929A Unspecified injury of unspecified foot, initial encounter: Principal | ICD-10-CM

## 2013-11-23 DIAGNOSIS — S8990XA Unspecified injury of unspecified lower leg, initial encounter: Secondary | ICD-10-CM | POA: Diagnosis not present

## 2013-11-23 DIAGNOSIS — W010XXA Fall on same level from slipping, tripping and stumbling without subsequent striking against object, initial encounter: Secondary | ICD-10-CM | POA: Diagnosis not present

## 2013-11-23 DIAGNOSIS — Y9389 Activity, other specified: Secondary | ICD-10-CM | POA: Insufficient documentation

## 2013-11-23 DIAGNOSIS — M1710 Unilateral primary osteoarthritis, unspecified knee: Secondary | ICD-10-CM | POA: Diagnosis not present

## 2013-11-23 DIAGNOSIS — IMO0002 Reserved for concepts with insufficient information to code with codable children: Secondary | ICD-10-CM

## 2013-11-23 MED ORDER — HYDROCODONE-ACETAMINOPHEN 5-325 MG PO TABS: 1.0000 | ORAL_TABLET | ORAL | Status: DC | PRN

## 2013-11-23 MED ORDER — TRAMADOL HCL 50 MG PO TABS
50.0000 mg | ORAL_TABLET | Freq: Once | ORAL | Status: AC
  Administered 2013-11-23: 50 mg via ORAL
  Filled 2013-11-23: qty 1

## 2013-11-23 MED ORDER — NAPROXEN 375 MG PO TABS: 375.0000 mg | ORAL_TABLET | Freq: Two times a day (BID) | ORAL | Status: DC

## 2013-11-23 NOTE — Discharge Instructions (Signed)
Knee Bracing  Knee braces are supports to help stabilize and protect an injured or painful knee. They come in many different styles. They should support and protect the knee without increasing the chance of other injuries to yourself or others. It is important not to have a false sense of security when using a brace. Knee braces that help you to keep using your knee:  · Do not restore normal knee stability under high stress forces.  · May decrease some aspects of athletic performance.  Some of the different types of knee braces are:  · Prophylactic knee braces are designed to prevent or reduce the severity of knee injuries during sports that make injury to the knee more likely.  · Rehabilitative knee braces are designed to allow protected motion of:  ¨ Injured knees.  ¨ Knees that have been treated with or without surgery.  There is no evidence that the use of a supportive knee brace protects the graft following a successful anterior cruciate ligament (ACL) reconstruction. However, braces are sometimes used to:   · Protect injured ligaments.  · Control knee movement during the initial healing period.  They may be used as part of the treatment program for the various injured ligaments or cartilage of the knee including the:  · Anterior cruciate ligament.  · Medial collateral ligament.  · Medial or lateral cartilage (meniscus).  · Posterior cruciate ligament.  · Lateral collateral ligament.  Rehabilitative knee braces are most commonly used:  · During crutch-assisted walking right after injury.  · During crutch-assisted walking right after surgery to repair the cartilage and/or cruciate ligament injury.  · For a short period of time, 2-8 weeks, after the injury or surgery.  The value of a rehabilitative brace as opposed to a cast or splint includes the:  · Ability to adjust the brace for swelling.  · Ability to remove the brace for examinations, icing, or showering.  · Ability to allow for movement in a controlled  range of motion.  Functional knee braces give support to knees that have already been injured. They are designed to provide stability for the injured knee and provide protection after repair. Functional knee braces may not affect performance much. Lower extremity muscle strengthening, flexibility, and improvement in technique are more important than bracing in treating ligamentous knee injuries. Functional braces are not a substitute for rehabilitation or surgical procedures.  Unloader/off-loader braces are designed to provide pain relief in arthritic knees. Patients with wear and tear arthritis from growing old or from an old cartilage injury (osteoarthritis) of the knee, and bowlegged (varus) or knock-knee (valgus) deformities, often develop increased pain in the arthritic side due to increased loading. Unloader/off-loader braces are made to reduce uneven loading in such knees. There is reduction in bowing out movement in bowlegged knees when the correct unloader brace is used. Patients with advanced osteoarthritis or severe varus or valgus alignment problems would not likely benefit from bracing.  Patellofemoral braces help the kneecap to move smoothly and well centered over the end of the femur in the knee.   Most people who wear knee braces feel that they help. However, there is a lack of scientific evidence that knee braces are helpful at the level needed for athletic participation to prevent injury. In spite of this, athletes report an increase in knee stability, pain relief, performance improvement, and confidence during athletics when using a brace.   Different knee problems require different knee braces:  · Your caregiver may suggest one   also need one for pain in the front of your knee that is not getting better with strengthening and  flexibility exercises. Get your caregiver's advice if you want to try a knee brace. The caregiver will advise you on where to get them and provide a prescription when it is needed to fashion and/or fit the brace. Knee braces are the least important part of preventing knee injuries or getting better following injury. Stretching, strengthening and technique improvement are far more important in caring for and preventing knee injuries. When strengthening your knee, increase your activities a little at a time so as not to develop injuries from overuse. Work out an exercise plan with your caregiver and/or physical therapist to get the best program for you. Do not let a knee brace become a crutch. Always remember, there are no braces which support the knee as well as your original ligaments and cartilage you were born with. Conditioning, proper warm-up, and stretching remain the most important parts of keeping your knees healthy. HOW TO USE A KNEE BRACE  During sports, knee braces should be used as directed by your caregiver.  Make sure that the hinges are where the knee bends.  Straps, tapes, or hook-and-loop tapes should be fastened around your leg as instructed.  You should check the placement of the brace during activities to make sure that it has not moved. Poorly positioned braces can hurt rather than help you.  To work well, a knee brace should be worn during all activities that put you at risk of knee injury.  Warm up properly before beginning athletic activities. HOME CARE INSTRUCTIONS  Knee braces often get damaged during normal use. Replace worn-out braces for maximum benefit.  Clean regularly with soap and water.  Inspect your brace often for wear and tear.  Cover exposed metal to protect others from injury.  Durable materials may cost more, but last longer. SEEK IMMEDIATE MEDICAL CARE IF:   Your knee seems to be getting worse rather than better.  You have increasing pain or  swelling in the knee.  You have problems caused by the knee brace.  You have increased swelling or inflammation (redness or soreness) in your knee.  Your knee becomes warm and more painful and you develop an unexplained temperature over 101F (38.3C). MAKE SURE YOU:   Understand these instructions.  Will watch your condition.  Will get help right away if you are not doing well or get worse. See your caregiver, physical therapist, or orthopedic surgeon for additional information. Document Released: 05/05/2003 Document Revised: 06/29/2013 Document Reviewed: 08/11/2008 [facility] Patient Information 2015 [place], [place]. This information is not intended to replace advice given to you by your health care provider. Make sure you discuss any questions you have with your health care provider. Knee Immobilizer A knee immobilizer is used to support and protect an injured or painful knee. Knee immobilizers keep your knee from being used while it is healing. Some of the common immobilizers used include splints (air, plaster, fiberglass, stiff cloth, or aluminum) or casts. Wear your knee immobilizer as instructed and only remove it as instructed. HOME CARE INSTRUCTIONS   Use absorbent powder (such as baby powder or talcum powder) to control irritation from sweat and friction.  Adjust the immobilizer to be firm but not tight. Signs of an immobilizer that is too tight include:  Swelling.  Numbness.  Color change in your foot or ankle.  Increased pain.  While resting, raise your leg above the level of  your heart. Pillows can be used for support. This reduces throbbing and helps healing.  Remove the immobilizer to bathe and sleep. SEEK MEDICAL CARE IF:   You have increasing pain or swelling in the knee, foot, or ankle.  You have problems caused by the knee immobilizer, or it breaks or needs replacement. MAKE SURE YOU:   Understand these instructions.  Will watch your condition.  Will  get help right away if you are not doing well or get worse. Document Released: 02/12/2005 Document Revised: 06/29/2013 Document Reviewed: 10/06/2012 [facility] Patient Information 2015 [place], [place]. This information is not intended to replace advice given to you by your health care provider. Make sure you discuss any questions you have with your health care provider.

## 2013-11-23 NOTE — ED Provider Notes (Signed)
Medical screening examination/treatment/procedure(s) were performed by non-physician practitioner and as supervising physician I was immediately available for consultation/collaboration.   EKG Interpretation None       Collen Hostler K Cailan General-Rasch, MD 11/23/13 [phone]
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
## 2013-11-23 NOTE — ED Notes (Signed)
Called Ortho to bedside. 

## 2013-11-23 NOTE — ED Notes (Signed)
Patient states she slipped and fell on a puddle of vegetable oil while shopping last night at [employer] ([employer] Patient c/o right knee pain that is radiating towards her foot. Patient states she was able to bear weight without difficulty initially but now has burning, shooting pain in her knee that is progressively worsening. Patient reports using ice pack at home, denies taking any medication for pain.

## 2013-12-28 ENCOUNTER — Encounter (HOSPITAL_COMMUNITY): Payer: Self-pay | Admitting: Emergency Medicine

## 2015-02-28 ENCOUNTER — Emergency Department (HOSPITAL_COMMUNITY): Payer: Medicaid Other

## 2015-02-28 ENCOUNTER — Emergency Department (HOSPITAL_COMMUNITY)
Admission: EM | Admit: 2015-02-28 | Discharge: 2015-03-01 | Disposition: A | Discharge status: Home / Self Care | Payer: Medicaid Other | Attending: Emergency Medicine | Admitting: Emergency Medicine

## 2015-02-28 ENCOUNTER — Encounter (HOSPITAL_COMMUNITY): Payer: Self-pay | Admitting: Family Medicine

## 2015-02-28 DIAGNOSIS — Z79899 Other long term (current) drug therapy: Secondary | ICD-10-CM | POA: Diagnosis not present

## 2015-02-28 DIAGNOSIS — Z8614 Personal history of Methicillin resistant Staphylococcus aureus infection: Secondary | ICD-10-CM | POA: Diagnosis not present

## 2015-02-28 DIAGNOSIS — F410 Panic disorder [episodic paroxysmal anxiety] without agoraphobia: Secondary | ICD-10-CM | POA: Diagnosis not present

## 2015-02-28 DIAGNOSIS — M25461 Effusion, right knee: Secondary | ICD-10-CM | POA: Insufficient documentation

## 2015-02-28 DIAGNOSIS — F17210 Nicotine dependence, cigarettes, uncomplicated: Secondary | ICD-10-CM | POA: Insufficient documentation

## 2015-02-28 DIAGNOSIS — Z853 Personal history of malignant neoplasm of breast: Secondary | ICD-10-CM | POA: Diagnosis not present

## 2015-02-28 DIAGNOSIS — Z791 Long term (current) use of non-steroidal anti-inflammatories (NSAID): Secondary | ICD-10-CM | POA: Diagnosis not present

## 2015-02-28 DIAGNOSIS — F329 Major depressive disorder, single episode, unspecified: Secondary | ICD-10-CM | POA: Diagnosis not present

## 2015-02-28 DIAGNOSIS — M1990 Unspecified osteoarthritis, unspecified site: Secondary | ICD-10-CM | POA: Diagnosis not present

## 2015-02-28 DIAGNOSIS — M25561 Pain in right knee: Secondary | ICD-10-CM | POA: Diagnosis present

## 2015-02-28 NOTE — ED Notes (Signed)
Pt reports right knee pain with swelling. Denies any recent injury. Symptoms started 12/14. No recent OTC medication for symptoms.
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
## 2015-03-01 MED ORDER — TRAMADOL HCL 50 MG PO TABS: 50.0000 mg | ORAL_TABLET | Freq: Four times a day (QID) | ORAL | Status: DC | PRN

## 2015-03-01 MED ORDER — NAPROXEN 375 MG PO TABS: 375.0000 mg | ORAL_TABLET | Freq: Three times a day (TID) | ORAL | Status: DC

## 2015-03-01 NOTE — Discharge Instructions (Signed)
Knee Effusion Knee effusion means that you have extra fluid in your knee. This can cause pain. Your knee may be more difficult to bend and move. HOME CARE  Use crutches as told by your doctor.  Wear a knee brace as told by your doctor.  Apply ice to the swollen area:  Put ice in a plastic bag.  Place a towel between your skin and the bag.  Leave the ice on for 20 minutes, 2-3 times per day.  Keep your knee raised (elevated) when you are sitting or lying down.  Take medicines only as told by your doctor.  Do any rehabilitation or strengthening exercises as told by your doctor.  Rest your knee as told by your doctor. You may start doing your normal activities again when your doctor says it is okay.  Keep all follow-up visits as told by your doctor. This is important. GET HELP IF:   You continue to have pain in your knee. GET HELP RIGHT AWAY IF:  You have increased swelling or redness of your knee.  You have severe pain in your knee.  You have a fever.   This information is not intended to replace advice given to you by your health care provider. Make sure you discuss any questions you have with your health care provider.   Document Released: 03/17/2010 Document Revised: 03/05/2014 Document Reviewed: 09/28/2013 Elsevier Interactive Patient Education 2016 [place] have a small effusion on xray please take the Naprosyn on a regular basis, use the ultram for more severe pain wear the knee sleeve for compression, make an appointment with the Orthopedist for further evaluation including an MRI to further image your knee joint

## 2015-03-01 NOTE — ED Notes (Signed)
PT DISCHARGED. INSTRUCTIONS AND PRESCRIPTIONS GIVEN. AAOX3. PT IN NO APPARENT DISTRESS. THE OPPORTUNITY TO ASK QUESTIONS WAS PROVIDED. 

## 2015-03-15 ENCOUNTER — Ambulatory Visit: Payer: Medicaid Other | Attending: Family Medicine | Admitting: Physician Assistant

## 2015-03-15 VITALS — BP 101/64 | HR 62 | Temp 97.8°F | Resp 16 | Wt 217.0 lb

## 2015-03-15 DIAGNOSIS — M25461 Effusion, right knee: Secondary | ICD-10-CM | POA: Diagnosis not present

## 2015-03-15 DIAGNOSIS — M25561 Pain in right knee: Secondary | ICD-10-CM

## 2015-03-15 DIAGNOSIS — G8929 Other chronic pain: Secondary | ICD-10-CM | POA: Insufficient documentation

## 2015-03-15 DIAGNOSIS — M1711 Unilateral primary osteoarthritis, right knee: Secondary | ICD-10-CM | POA: Insufficient documentation

## 2015-03-15 DIAGNOSIS — M25469 Effusion, unspecified knee: Secondary | ICD-10-CM | POA: Insufficient documentation

## 2015-03-15 DIAGNOSIS — M25562 Pain in left knee: Secondary | ICD-10-CM

## 2015-03-15 DIAGNOSIS — F329 Major depressive disorder, single episode, unspecified: Secondary | ICD-10-CM | POA: Insufficient documentation

## 2015-03-15 NOTE — Progress Notes (Signed)
Patient here for ED f/u right knee effusion.   Patient rates pain 4/10, described as tender, and sore.   Patient states the pain radiates up her right thigh causing her pain when she sits for an extensive period of time.
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
## 2015-03-16 ENCOUNTER — Ambulatory Visit: Payer: Medicaid Other | Admitting: Family Medicine

## 2015-03-16 ENCOUNTER — Ambulatory Visit (INDEPENDENT_AMBULATORY_CARE_PROVIDER_SITE_OTHER): Payer: Medicaid Other | Admitting: Family Medicine

## 2015-03-16 ENCOUNTER — Encounter: Payer: Self-pay | Admitting: Family Medicine

## 2015-03-16 VITALS — BP 116/78 | HR 81 | Ht 63.0 in | Wt 217.0 lb

## 2015-03-16 DIAGNOSIS — M25561 Pain in right knee: Secondary | ICD-10-CM

## 2015-03-16 MED ORDER — DICLOFENAC SODIUM 75 MG PO TBEC: 75.0000 mg | DELAYED_RELEASE_TABLET | Freq: Two times a day (BID) | ORAL | Status: DC

## 2015-03-16 MED ORDER — METHYLPREDNISOLONE ACETATE 40 MG/ML IJ SUSP
40.0000 mg | Freq: Once | INTRAMUSCULAR | Status: AC
  Administered 2015-03-16: 40 mg via INTRA_ARTICULAR

## 2015-03-16 NOTE — Patient Instructions (Signed)
Your knee pain and swelling are likely due to an acute gout flare though a degenerative meniscus tear and mild arthritis are possibilities as well. All are treated similarly initially. Voltaren twice a day with food for pain and inflammation. Cortisone injection is an option. It's important that you continue to stay active. Straight leg raises, knee extensions 3 sets of 10 once a day (add ankle weight if these become too easy). Consider physical therapy to strengthen muscles around the joint that hurts to take pressure off of the joint itself. Shoe inserts with good arch support may be helpful. Heat or ice 15 minutes at a time 3-4 times a day as needed to help with pain. Consider prednisone dose pack as well. Follow up with me in 1 month for reevaluation.

## 2015-03-17 NOTE — Assessment & Plan Note (Signed)
acute onset without injury and describes significant swelling.  Independently reviewed radiographs and no abnormalities here including DJD.  Most consistent with an initial acute gout flare that has improved to large degree now.  We discussed her options and she would like to do cortisone injection which was given today.  Shown home exercises to do daily.  Ice preferred over heat for the swelling she has had.  Voltaren twice a day with food.  F/u in 1 month for reevaluation.  After informed written consent, patient was seated on exam table. Right knee was prepped with alcohol swab and utilizing anteromedial approach, patient's right knee was injected intraarticularly with 3:1 marcaine: depomedrol. Patient tolerated the procedure well without immediate complications.
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
## 2015-04-14 ENCOUNTER — Encounter: Payer: Self-pay | Admitting: Family Medicine

## 2015-04-14 ENCOUNTER — Ambulatory Visit (INDEPENDENT_AMBULATORY_CARE_PROVIDER_SITE_OTHER): Payer: Medicaid Other | Admitting: Family Medicine

## 2015-04-14 VITALS — BP 109/69 | HR 84 | Ht 63.0 in | Wt 206.0 lb

## 2015-04-14 DIAGNOSIS — M25561 Pain in right knee: Secondary | ICD-10-CM

## 2015-04-14 NOTE — Patient Instructions (Signed)
You are now dealing with quad weakness, spasms. Do home exercises - straight leg raises, knee extension, half-squats, half-lunges. 3 sets of 10 once a day. Add ankle weight if these become too easy. Icing if needed 15 minutes at a time after working out. Call me if you want to do physical therapy or you're having problems. Follow up with me in 5-6 weeks otherwise.

## 2015-04-18 NOTE — Assessment & Plan Note (Signed)
Initially consistent with acute gout flare that has improved.  Pain now primarily quad weakness, spasms, tendinopathy.  Shown home exercises to do daily.  Icing, tylenol/nsaids as needed.  F/u in 5-6 weeks.  Consider physical therapy if not improving.
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
## 2015-05-24 ENCOUNTER — Ambulatory Visit: Payer: Medicaid Other | Admitting: Family Medicine

## 2015-06-01 ENCOUNTER — Ambulatory Visit (INDEPENDENT_AMBULATORY_CARE_PROVIDER_SITE_OTHER): Payer: Medicaid Other | Admitting: Family Medicine

## 2015-06-01 ENCOUNTER — Encounter: Payer: Self-pay | Admitting: Family Medicine

## 2015-06-01 VITALS — BP 99/65 | HR 77 | Ht 63.0 in | Wt 214.0 lb

## 2015-06-01 DIAGNOSIS — M25561 Pain in right knee: Secondary | ICD-10-CM | POA: Diagnosis present

## 2015-06-01 NOTE — Patient Instructions (Signed)
Call me if you have any questions otherwise follow up as needed.
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
## 2015-06-02 NOTE — Assessment & Plan Note (Signed)
Initially consistent with acute gout flare that improved then quad weakness, spasms, tendinopathy.  Improved with home exercises, icing.  F/u prn.

## 2015-07-25 ENCOUNTER — Encounter: Payer: Medicaid Other | Admitting: Family Medicine

## 2015-07-28 ENCOUNTER — Ambulatory Visit: Payer: Medicaid Other | Attending: Family Medicine | Admitting: Family Medicine

## 2015-07-28 ENCOUNTER — Encounter: Payer: Self-pay | Admitting: Family Medicine

## 2015-07-28 ENCOUNTER — Other Ambulatory Visit: Payer: Self-pay | Admitting: Family Medicine

## 2015-07-28 ENCOUNTER — Other Ambulatory Visit (HOSPITAL_COMMUNITY)
Admission: RE | Admit: 2015-07-28 | Discharge: 2015-07-28 | Disposition: A | Discharge status: Home / Self Care | Payer: Medicaid Other | Source: Ambulatory Visit | Attending: Family Medicine | Admitting: Family Medicine

## 2015-07-28 VITALS — BP 100/64 | HR 66 | Temp 98.6°F | Resp 16 | Ht 63.0 in | Wt 215.0 lb

## 2015-07-28 DIAGNOSIS — N760 Acute vaginitis: Secondary | ICD-10-CM | POA: Diagnosis present

## 2015-07-28 DIAGNOSIS — F259 Schizoaffective disorder, unspecified: Secondary | ICD-10-CM | POA: Insufficient documentation

## 2015-07-28 DIAGNOSIS — F17210 Nicotine dependence, cigarettes, uncomplicated: Secondary | ICD-10-CM | POA: Insufficient documentation

## 2015-07-28 DIAGNOSIS — Z01411 Encounter for gynecological examination (general) (routine) with abnormal findings: Secondary | ICD-10-CM | POA: Insufficient documentation

## 2015-07-28 DIAGNOSIS — Z23 Encounter for immunization: Secondary | ICD-10-CM | POA: Diagnosis not present

## 2015-07-28 DIAGNOSIS — Z1151 Encounter for screening for human papillomavirus (HPV): Secondary | ICD-10-CM | POA: Insufficient documentation

## 2015-07-28 DIAGNOSIS — Z79899 Other long term (current) drug therapy: Secondary | ICD-10-CM | POA: Diagnosis not present

## 2015-07-28 DIAGNOSIS — Z114 Encounter for screening for human immunodeficiency virus [HIV]: Secondary | ICD-10-CM

## 2015-07-28 DIAGNOSIS — K5900 Constipation, unspecified: Secondary | ICD-10-CM | POA: Insufficient documentation

## 2015-07-28 DIAGNOSIS — F251 Schizoaffective disorder, depressive type: Secondary | ICD-10-CM

## 2015-07-28 DIAGNOSIS — F418 Other specified anxiety disorders: Secondary | ICD-10-CM | POA: Diagnosis not present

## 2015-07-28 DIAGNOSIS — Z0000 Encounter for general adult medical examination without abnormal findings: Secondary | ICD-10-CM | POA: Diagnosis not present

## 2015-07-28 DIAGNOSIS — K5901 Slow transit constipation: Secondary | ICD-10-CM | POA: Diagnosis not present

## 2015-07-28 DIAGNOSIS — Z113 Encounter for screening for infections with a predominantly sexual mode of transmission: Secondary | ICD-10-CM | POA: Diagnosis present

## 2015-07-28 DIAGNOSIS — Z124 Encounter for screening for malignant neoplasm of cervix: Secondary | ICD-10-CM

## 2015-07-28 LAB — COMPLETE METABOLIC PANEL WITH GFR
ALBUMIN: 4.1 g/dL (ref 3.6–5.1)
ALT: 10 U/L (ref 6–29)
AST: 12 U/L (ref 10–30)
Alkaline Phosphatase: 49 U/L (ref 33–115)
BILIRUBIN TOTAL: 0.4 mg/dL (ref 0.2–1.2)
BUN: 9 mg/dL (ref 7–25)
CO2: 20 mmol/L (ref 20–31)
Calcium: 9 mg/dL (ref 8.6–10.2)
Chloride: 108 mmol/L (ref 98–110)
Creat: 0.74 mg/dL (ref 0.50–1.10)
GFR, Est African American: >89 mL/min (ref >=60)
GFR, Est Non African American: >89 mL/min (ref >=60)
Glucose, Bld: 95 mg/dL (ref 65–99)
Potassium: 4 mmol/L (ref 3.5–5.3)
SODIUM: 138 mmol/L (ref 135–146)
TOTAL PROTEIN: 6.8 g/dL (ref 6.1–8.1)

## 2015-07-28 LAB — LIPID PANEL
Cholesterol: 166 mg/dL (ref 125–200)
HDL: 63 mg/dL (ref >=46)
LDL CALC: 91 mg/dL (ref <130)
Total CHOL/HDL Ratio: 2.6 Ratio (ref <=5.0)
Triglycerides: 59 mg/dL (ref <150)
VLDL: 12 mg/dL (ref <30)

## 2015-07-28 LAB — CBC
HCT: 35.6 % (ref 35.0–45.0)
Hemoglobin: 11.6 g/dL — ABNORMAL LOW (ref 11.7–15.5)
MCH: 32.3 pg (ref 27.0–33.0)
MCHC: 32.6 g/dL (ref 32.0–36.0)
MCV: 99.2 fL (ref 80.0–100.0)
MPV: 9.5 fL (ref 7.5–12.5)
Platelets: 327 10*3/uL (ref 140–400)
RBC: 3.59 MIL/uL — ABNORMAL LOW (ref 3.80–5.10)
RDW: 13.4 % (ref 11.0–15.0)
WBC: 6.2 10*3/uL (ref 3.8–10.8)

## 2015-07-28 LAB — POCT GLYCOSYLATED HEMOGLOBIN (HGB A1C): HEMOGLOBIN A1C: 5.5

## 2015-07-28 MED ORDER — DIAZEPAM 5 MG PO TABS: 5.0000 mg | ORAL_TABLET | Freq: Two times a day (BID) | ORAL | Status: DC | PRN

## 2015-07-28 NOTE — Progress Notes (Signed)
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 No sexually,  No suicidal thoughts in the past two weeks  No pain today  Tobacco user 1ppday

## 2015-07-28 NOTE — Assessment & Plan Note (Signed)
Intermittent constipation and bloating FODMAP diet recommended
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
## 2015-07-28 NOTE — Patient Instructions (Addendum)
Keather was seen today for annual exam.  Diagnoses and all orders for this visit:  Pap smear for cervical cancer screening -     Cytology - PAP  Depression with anxiety -     diazepam (VALIUM) 5 MG tablet; Take 1 tablet (5 mg total) by mouth every 12 (twelve) hours as needed for anxiety.  Schizoaffective disorder, depressive type (HCC)  Slow transit constipation  Healthcare maintenance -     Lipid Panel -     COMPLETE METABOLIC PANEL WITH GFR -     HgB A1c   Normal physical  Nabothian cyst on cervix   F/u in 6 weeks for anxiety/depression and to give me an update from mental health  Dr. Adrian Blackwater   Return to [place] or pursue mental health at [place] or [place]   Recommend dietary changes for bloating and constipation   1. Exclude foods that increase flatulence (eg, beans, onions, celery, carrots, raisins, bananas, apricots, prunes, Brussels sprouts, wheat germ, pretzels, and bagels), alcohol, and caffeine.  Have symptoms improved?   If yes, continue to excluded above  If no,  2. Exclude lactose   Have symptoms improved?  If yes, continue to exclude 1 and 2.  If no,  3. Exclude FODMAP Characteristics and sources of common FODMAPs  F Fermentable    O Oligosaccharides Fructans, galacto-oligosaccharides Wheat, barley, rye, onion, leek, white part of spring onion, garlic, shallots, artichokes, beetroot, fennel, peas, chicory, pistachio, cashews, legumes, lentils, and chickpeas  D Disaccharides Lactose Milk, custard, ice cream, and yogurt  M Monosaccharides "Free fructose" (fructose in excess of glucose) Apples, pears, mangoes, cherries, watermelon, asparagus, sugar snap peas, honey, high-fructose corn syrup  A And  P Polyols Sorbitol, mannitol, maltitol, and xylitol Apples, pears, apricots, cherries, nectarines, peaches, plums, watermelon, mushrooms, cauliflower, artificially sweetened chewing gum and confectionery   Be sure to drink plenty  of fluids to keep up with water loses from diarrhea

## 2015-07-28 NOTE — Assessment & Plan Note (Signed)
Patient reports being diagnosed with schizoaffective disorder and therefore being treated with depakote. She reports a previous diagnosis and bipolar disorder. She is anxious a and depressed. No SI.  Plan: Valium for anxiety symptoms Mental health evaluation either at [facility] or another local provider, I have recommended family services of the [facility] Referral placed

## 2015-07-29 LAB — CERVICOVAGINAL ANCILLARY ONLY
Chlamydia: NEGATIVE
Neisseria Gonorrhea: NEGATIVE
Wet Prep (BD Affirm): NEGATIVE

## 2015-07-29 LAB — HIV ANTIBODY (ROUTINE TESTING W REFLEX): HIV 1&2 Ab, 4th Generation: NONREACTIVE

## 2015-07-29 LAB — CYTOLOGY - PAP

## 2015-09-09 ENCOUNTER — Encounter: Payer: Self-pay | Admitting: Family Medicine

## 2015-09-09 ENCOUNTER — Ambulatory Visit: Payer: Medicaid Other | Attending: Family Medicine | Admitting: Family Medicine

## 2015-09-09 VITALS — BP 93/60 | HR 80 | Temp 99.0°F | Resp 18 | Ht 63.0 in | Wt 211.0 lb

## 2015-09-09 DIAGNOSIS — F251 Schizoaffective disorder, depressive type: Secondary | ICD-10-CM

## 2015-09-09 DIAGNOSIS — F418 Other specified anxiety disorders: Secondary | ICD-10-CM | POA: Diagnosis not present

## 2015-09-09 MED ORDER — DIAZEPAM 5 MG PO TABS: 5.0000 mg | ORAL_TABLET | Freq: Two times a day (BID) | ORAL | Status: DC | PRN
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
## 2015-09-09 NOTE — Patient Instructions (Addendum)
Camas was seen today for anxiety.  Diagnoses and all orders for this visit:  Depression with anxiety -     diazepam (VALIUM) 5 MG tablet; Take 1 tablet (5 mg total) by mouth every 12 (twelve) hours as needed for anxiety.   Counseling services available at [facility] of [place].   Please have the records of your evaluation sent to me Attn. Dr. Boykin Nearing Fax # [phone]  F/u in 6 weeks after your psychiatric evaluation  Dr. Adrian Blackwater

## 2015-09-09 NOTE — Assessment & Plan Note (Signed)
Patient reports being diagnosed with schizoaffective disorder and therefore being treated with depakote. She reports a previous diagnosis and bipolar disorder. She is anxious a and depressed. No SI.  Plan: Valium for anxiety symptoms Mental health evaluation at [facility] services of the [facility] Referral placed
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
## 2015-09-19 ENCOUNTER — Encounter: Payer: Self-pay | Admitting: Family Medicine

## 2015-09-19 ENCOUNTER — Ambulatory Visit (INDEPENDENT_AMBULATORY_CARE_PROVIDER_SITE_OTHER): Payer: Medicaid Other | Admitting: Family Medicine

## 2015-09-19 VITALS — BP 123/81 | HR 82 | Ht 63.0 in | Wt 211.0 lb

## 2015-09-19 DIAGNOSIS — M25561 Pain in right knee: Secondary | ICD-10-CM

## 2015-09-19 MED ORDER — METHYLPREDNISOLONE ACETATE 40 MG/ML IJ SUSP
40.0000 mg | Freq: Once | INTRAMUSCULAR | Status: AC
  Administered 2015-09-19: 40 mg via INTRA_ARTICULAR

## 2015-09-19 NOTE — Patient Instructions (Signed)
As we discussed this is likely a resolving gout flare or very mild arthritis. Aleve 2 tabs twice a day with food OR ibuprofen 600mg  three times a day with food for pain and inflammation for 7-10 days. Consider glucosamine 750mg  twice a day as a supplement that may help. Topical capsaicin, aspercreme, or biofreeze up to 4 times a day. Straight leg raises, knee extensions 3 sets of 10 once a day (add ankle weight if these become too easy). Consider physical therapy to strengthen muscles around the joint that hurts to take pressure off of the joint itself. Shoe inserts with good arch support may be helpful. Heat or ice 15 minutes at a time 3-4 times a day as needed to help with pain. Compression sleeve, ACE wrap, or stockings for compression. Elevate above your heart when possible. You were given a cortisone injection today. Follow up with me in 1 month for reevaluation.

## 2015-09-22 NOTE — Assessment & Plan Note (Signed)
Still with some peroneal tenderness along with joint pain, reports of effusion though none currently.  Likely a flare of mild arthritis vs resolving gout flare.  NSAIDs for 7-10 days, icing, elevation, compression.  She chose to go ahead with intraarticular injection also.  Discussed tylenol, glucosamine, topical medications as well.  F/u in 1 month for reevaluation.   After informed written consent, patient was lying supine on exam table. Right knee was prepped with alcohol swab and utilizing superolateral approach with ultrasound guidance, patient's right knee was injected intraarticularly with 3:1 marcaine: depomedrol. Patient tolerated the procedure well without immediate complications.
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
## 2015-10-11 ENCOUNTER — Ambulatory Visit: Payer: Medicaid Other | Attending: Family Medicine | Admitting: Family Medicine

## 2015-10-11 ENCOUNTER — Encounter: Payer: Self-pay | Admitting: Family Medicine

## 2015-10-11 DIAGNOSIS — F418 Other specified anxiety disorders: Secondary | ICD-10-CM

## 2015-10-11 MED ORDER — DIAZEPAM 5 MG PO TABS
5.0000 mg | ORAL_TABLET | Freq: Two times a day (BID) | ORAL | 2 refills | Status: DC | PRN
Start: 2015-10-11 — End: 2017-12-27

## 2015-10-11 MED ORDER — DIAZEPAM 5 MG PO TABS: 5.0000 mg | ORAL_TABLET | Freq: Two times a day (BID) | ORAL | 0 refills | Status: DC | PRN

## 2015-10-11 NOTE — Progress Notes (Signed)
Pt states she is for anxiety and depression follow up. Pt states she is still feeling the same, there has been no change.

## 2015-10-11 NOTE — Patient Instructions (Addendum)
Julia Newman was seen today for follow-up.  Diagnoses and all orders for this visit:  Depression with anxiety -     Discontinue: diazepam (VALIUM) 5 MG tablet; Take 1 tablet (5 mg total) by mouth every 12 (twelve) hours as needed for anxiety. -     diazepam (VALIUM) 5 MG tablet; Take 1 tablet (5 mg total) by mouth every 12 (twelve) hours as needed for anxiety.   F/u in 4 weeks for flu shot with flu clinic or RN  F/u with me in 3 months  Dr. Adrian Blackwater

## 2015-10-11 NOTE — Assessment & Plan Note (Signed)
Depression and anxiety marked with excessive fear  Plan: Refilled diazepam Advised patient to [facility] for mental health evaluation and ongoing treatment
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
## 2015-10-20 ENCOUNTER — Ambulatory Visit: Payer: Medicaid Other | Admitting: Family Medicine

## 2015-12-09 ENCOUNTER — Encounter (HOSPITAL_COMMUNITY): Payer: Self-pay

## 2015-12-09 ENCOUNTER — Emergency Department (HOSPITAL_COMMUNITY)
Admission: EM | Admit: 2015-12-09 | Discharge: 2015-12-10 | Disposition: A | Discharge status: Home / Self Care | Payer: Medicaid Other | Attending: Emergency Medicine | Admitting: Emergency Medicine

## 2015-12-09 DIAGNOSIS — F319 Bipolar disorder, unspecified: Secondary | ICD-10-CM | POA: Diagnosis not present

## 2015-12-09 DIAGNOSIS — R45851 Suicidal ideations: Secondary | ICD-10-CM | POA: Diagnosis present

## 2015-12-09 DIAGNOSIS — F17210 Nicotine dependence, cigarettes, uncomplicated: Secondary | ICD-10-CM | POA: Diagnosis not present

## 2015-12-09 DIAGNOSIS — F418 Other specified anxiety disorders: Secondary | ICD-10-CM | POA: Diagnosis present

## 2015-12-09 DIAGNOSIS — F250 Schizoaffective disorder, bipolar type: Secondary | ICD-10-CM | POA: Diagnosis not present

## 2015-12-09 DIAGNOSIS — F259 Schizoaffective disorder, unspecified: Secondary | ICD-10-CM | POA: Diagnosis present

## 2015-12-09 DIAGNOSIS — Z79899 Other long term (current) drug therapy: Secondary | ICD-10-CM | POA: Insufficient documentation

## 2015-12-09 LAB — COMPREHENSIVE METABOLIC PANEL
ALK PHOS: 48 U/L (ref 38–126)
ALT: 13 U/L — AB (ref 14–54)
AST: 17 U/L (ref 15–41)
Albumin: 3.8 g/dL (ref 3.5–5.0)
Anion gap: 6 (ref 5–15)
BILIRUBIN TOTAL: 0.4 mg/dL (ref 0.3–1.2)
BUN: 8 mg/dL (ref 6–20)
CALCIUM: 9.1 mg/dL (ref 8.9–10.3)
CO2: 24 mmol/L (ref 22–32)
CREATININE: 0.76 mg/dL (ref 0.44–1.00)
Chloride: 110 mmol/L (ref 101–111)
Glucose, Bld: 95 mg/dL (ref 65–99)
Potassium: 3.8 mmol/L (ref 3.5–5.1)
Sodium: 140 mmol/L (ref 135–145)
TOTAL PROTEIN: 7.2 g/dL (ref 6.5–8.1)

## 2015-12-09 LAB — CBC
HCT: 36.7 % (ref 36.0–46.0)
Hemoglobin: 11.9 g/dL — ABNORMAL LOW (ref 12.0–15.0)
MCH: 33.3 pg (ref 26.0–34.0)
MCHC: 32.4 g/dL (ref 30.0–36.0)
MCV: 102.8 fL — ABNORMAL HIGH (ref 78.0–100.0)
PLATELETS: 265 10*3/uL (ref 150–400)
RBC: 3.57 MIL/uL — ABNORMAL LOW (ref 3.87–5.11)
RDW: 13.2 % (ref 11.5–15.5)
WBC: 6.4 10*3/uL (ref 4.0–10.5)

## 2015-12-09 LAB — ETHANOL

## 2015-12-09 LAB — SALICYLATE LEVEL

## 2015-12-09 LAB — I-STAT BETA HCG BLOOD, ED (MC, WL, AP ONLY): I-stat hCG, quantitative: <5 m[IU]/mL (ref <5)

## 2015-12-09 LAB — ACETAMINOPHEN LEVEL: Acetaminophen (Tylenol), Serum: <10 ug/mL — ABNORMAL LOW (ref 10–30)
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
## 2015-12-09 NOTE — ED Notes (Signed)
Bed: WTR5 Expected date:  Expected time:  Means of arrival:  Comments: 

## 2015-12-09 NOTE — ED Notes (Signed)
Report called to Bethena Roys, Therapist, sports.  Awaiting holding orders.  Patient to go to room 40 when holding orders received.
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
## 2015-12-10 ENCOUNTER — Encounter (HOSPITAL_COMMUNITY): Payer: Self-pay | Admitting: Registered Nurse

## 2015-12-10 DIAGNOSIS — F418 Other specified anxiety disorders: Secondary | ICD-10-CM | POA: Diagnosis not present

## 2015-12-10 DIAGNOSIS — F17210 Nicotine dependence, cigarettes, uncomplicated: Secondary | ICD-10-CM

## 2015-12-10 DIAGNOSIS — F250 Schizoaffective disorder, bipolar type: Secondary | ICD-10-CM

## 2015-12-10 DIAGNOSIS — Z79899 Other long term (current) drug therapy: Secondary | ICD-10-CM | POA: Diagnosis not present

## 2015-12-10 LAB — RAPID URINE DRUG SCREEN, HOSP PERFORMED
AMPHETAMINES: NOT DETECTED
BARBITURATES: NOT DETECTED
Benzodiazepines: POSITIVE — AB
COCAINE: NOT DETECTED
OPIATES: NOT DETECTED
TETRAHYDROCANNABINOL: POSITIVE — AB

## 2015-12-10 MED ORDER — DIAZEPAM 5 MG PO TABS: 5.0000 mg | ORAL_TABLET | Freq: Two times a day (BID) | ORAL | Status: DC | PRN

## 2015-12-10 NOTE — ED Notes (Addendum)
Nurse stephanie S. gave the okay for family member to watch patient to calm pt down, so sitter step out of room.
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
## 2015-12-10 NOTE — ED Notes (Signed)
Breakfast tray sitting on pt bedside table. Pt reports the tray stinks and to get it out of her room. Pt voices she does not eat pork. This nurse took tray out of pt room per her request, pork free tray ordered.

## 2015-12-10 NOTE — [facility] Suicide Risk Assessment (Signed)
Suicide Risk Assessment  Discharge Assessment   [facility] Discharge Suicide Risk Assessment   Principal Problem: Depression with anxiety Discharge Diagnoses:  Patient Active Problem List   Diagnosis Date Noted  . Depression with anxiety [F41.8] 07/28/2015  . Schizoaffective disorder ([place]) [F25.9] 07/28/2015  . Constipation [K59.00] 07/28/2015  . Knee pain, right [M25.561] 03/15/2015  . Knee effusion [M25.469] 03/15/2015  . Breast mass, right [N63.10] 06/29/2013  . Dysmenorrhea [N94.6] 11/24/2011  . Thickened endometrium [R93.8] 11/24/2011  . Proteinuria [R80.9] 11/24/2011    Total Time spent with patient: 30 minutes Musculoskeletal: Strength & Muscle Tone: within normal limits Gait & Station: normal Patient leans: N/A  Psychiatric Specialty Exam: Physical Exam  Neck: Normal range of motion.  Musculoskeletal: Normal range of motion.  Neurological: She is alert.  Psychiatric: Her speech is normal and behavior is normal. Thought content normal. Her mood appears anxious. Cognition and memory are normal. She expresses impulsivity. She exhibits a depressed mood.    Review of Systems  Psychiatric/Behavioral: Negative for depression, hallucinations, substance abuse and suicidal ideas. The patient is not nervous/anxious and does not have insomnia.   All other systems reviewed and are negative.   Blood pressure 116/59, pulse 78, temperature 98.6 F (37 C), temperature source Oral, resp. rate 17, SpO2 100 %.There is no height or weight on file to calculate BMI.  General Appearance: Casual  Eye Contact:  Good  Speech:  Clear and Coherent and Normal Rate  Volume:  Normal  Mood:  Depressed  Affect:  Congruent  Thought Process:  Coherent  Orientation:  Full (Time, Place, and Person)  Thought Content:  Logical  Suicidal Thoughts:  No  Homicidal Thoughts:  No  Memory:  Immediate;   Good Recent;   Good Remote;   Good  Judgement:  Intact  Insight:  Present  Psychomotor Activity:   Normal  Concentration:  Concentration: Good and Attention Span: Good  Recall:  Good  Fund of Knowledge:  Good  Language:  Good  Akathisia:  No  Handed:  Right  AIMS (if indicated):     Assets:  Communication Skills Desire for Improvement Housing Physical Health Social Support Transportation  ADL's:  Intact  Cognition:  WNL  Sleep:       Mental Status Per Nursing Assessment::   On Admission:   Anxiety/Depression  Demographic Factors:  NA  Loss Factors: NA  Historical Factors: Impulsivity  Risk Reduction Factors:   Employed, Living with another person, especially a relative, Positive social support and Positive therapeutic relationship  Continued Clinical Symptoms:  Previous Psychiatric Diagnoses and Treatments  Cognitive Features That Contribute To Risk:  None    Suicide Risk:  Minimal: No identifiable suicidal ideation.  Patients presenting with no risk factors but with morbid ruminations; may be classified as minimal risk based on the severity of the depressive symptoms    Plan Of Care/Follow-up recommendations:  Activity:  As tolerated Diet:  As tolerated  Rankin, Shuvon, NP 12/10/2015, 11:49 AM

## 2015-12-10 NOTE — ED Notes (Signed)
Pt discharged home per MD order.Discharge summary reviewed with pt. Pt verbalizes understanding of discharge summary. Pt denies SI/HI. Denies AVH. Pt reports she feels safe with her discharge plan. Pt signed for personal belongings and property returned. Pt signed e-signature. Pt sister in lobby. Ambulatory off unit with [facility].
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
## 2016-01-17 ENCOUNTER — Ambulatory Visit: Payer: Self-pay | Admitting: Family Medicine

## 2016-01-26 ENCOUNTER — Inpatient Hospital Stay (HOSPITAL_COMMUNITY)
Admission: AD | Admit: 2016-01-26 | Discharge: 2016-01-26 | Disposition: A | Discharge status: Home / Self Care | Payer: Medicaid Other | Source: Ambulatory Visit | Attending: Obstetrics & Gynecology | Admitting: Obstetrics & Gynecology

## 2016-01-26 ENCOUNTER — Encounter (HOSPITAL_COMMUNITY): Payer: Self-pay | Admitting: *Deleted

## 2016-01-26 DIAGNOSIS — Z79899 Other long term (current) drug therapy: Secondary | ICD-10-CM | POA: Diagnosis not present

## 2016-01-26 DIAGNOSIS — F17210 Nicotine dependence, cigarettes, uncomplicated: Secondary | ICD-10-CM | POA: Insufficient documentation

## 2016-01-26 DIAGNOSIS — N898 Other specified noninflammatory disorders of vagina: Secondary | ICD-10-CM | POA: Diagnosis not present

## 2016-01-26 LAB — URINALYSIS, ROUTINE W REFLEX MICROSCOPIC
BILIRUBIN URINE: NEGATIVE
GLUCOSE, UA: NEGATIVE mg/dL
Ketones, ur: NEGATIVE mg/dL
Leukocytes, UA: NEGATIVE
Nitrite: NEGATIVE
PH: 5 (ref 5.0–8.0)
Protein, ur: NEGATIVE mg/dL

## 2016-01-26 LAB — CBC
HEMATOCRIT: 34.1 % — AB (ref 36.0–46.0)
HEMOGLOBIN: 11.6 g/dL — AB (ref 12.0–15.0)
MCH: 33.7 pg (ref 26.0–34.0)
MCHC: 34 g/dL (ref 30.0–36.0)
MCV: 99.1 fL (ref 78.0–100.0)
Platelets: 310 10*3/uL (ref 150–400)
RBC: 3.44 MIL/uL — AB (ref 3.87–5.11)
RDW: 13.2 % (ref 11.5–15.5)
WBC: 5.4 10*3/uL (ref 4.0–10.5)

## 2016-01-26 LAB — WET PREP, GENITAL
SPERM: NONE SEEN
Trich, Wet Prep: NONE SEEN
YEAST WET PREP: NONE SEEN

## 2016-01-26 LAB — HCG, QUANTITATIVE, PREGNANCY: hCG, Beta Chain, Quant, S: <1 m[IU]/mL (ref <5)

## 2016-01-26 LAB — URINE MICROSCOPIC-ADD ON

## 2016-01-26 LAB — POCT PREGNANCY, URINE: PREG TEST UR: NEGATIVE

## 2016-01-26 LAB — GC/CHLAMYDIA PROBE AMP (~~LOC~~) NOT AT ARMC
CHLAMYDIA, DNA PROBE: NEGATIVE
Neisseria Gonorrhea: NEGATIVE
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
## 2016-01-26 NOTE — Discharge Instructions (Signed)
Patient information: Vaginal discharge in adults (The Basics) Written by the doctors and editors at [employer]  What is vaginal discharge? -- Vaginal discharge is the term doctors use to describe the fluid that comes out of the vagina (figure 1). Vaginal discharge is made up of cells from the vagina and cervix, bacteria, mucus, and water.  Can vaginal discharge be normal? -- Yes. Normally, women who get monthly periods can have a small amount of vaginal discharge each day. Normal vaginal discharge can be white, clear, or thick, but usually does not smell bad.   Different women can have different amounts of vaginal discharge. Also, a woman can have more or less vaginal discharge at different times. For example, a woman might notice more vaginal discharge:  ?When she is pregnant ?When she uses hormone birth control medicines ?During the 2 weeks before she gets her period   Women who have been through menopause usually have less vaginal discharge. Menopause is the time when a woman stops getting her monthly period.  When is vaginal discharge abnormal? -- Vaginal discharge is abnormal when it occurs with the following symptoms:   ?Itching of the vagina or the area around the vagina ?Redness, pain, or swelling around the vagina ?Discharge that is foamy, greenish-yellow, or has blood in it ?Discharge that smells bad ?Pain when urinating or having sex ?Pain in the lower part of the belly ?Fever   What are the causes of abnormal vaginal discharge? -- Different conditions can cause abnormal vaginal discharge. The most common causes are:  ?An infection in the vagina ?A reaction to something in the vagina that a woman forgot to take out, such as a tampon or condom ?A reaction to a soap or other product that was in the vagina ?Changes in the body that occur after menopause   If your abnormal vaginal discharge is caused by certain types of infections, your sex partner will also need to see a  doctor for treatment. (You might want to stop having sex until you know what is causing your symptoms.)  Can abnormal vaginal discharge be prevented? -- Sometimes. You can help prevent abnormal vaginal discharge by:  ?Using warm water and unscented non-soap cleanser to wash your vulva (the vulva is the area of skin around the outside of the vagina) ?Taking baths in plain warm water, and not using scented bath products ?Not using sprays or powders on your vagina ?Not douching (douching is when a woman puts a liquid inside her vagina to rinse it out) ?Not wiping with baby wipes or scented toilet paper after you use the toilet

## 2016-01-26 NOTE — MAU Note (Signed)
Pt states on 11/17 she had a positive home pregnancy test, LMP 12/14/2015. On 01/15/16 she repeated the test and it was negative x 2 and pt states she started having cramping and bleeding. On 11/21 she began having heaving bleeding and passing clots. This bleeding stopped this past weekend but now she has a discharge with an odor.

## 2016-02-26 ENCOUNTER — Ambulatory Visit (HOSPITAL_COMMUNITY)
Admission: EM | Admit: 2016-02-26 | Discharge: 2016-02-26 | Disposition: A | Discharge status: Home / Self Care | Payer: Medicaid Other | Attending: Emergency Medicine | Admitting: Emergency Medicine

## 2016-02-26 ENCOUNTER — Encounter (HOSPITAL_COMMUNITY): Payer: Self-pay | Admitting: Emergency Medicine

## 2016-02-26 DIAGNOSIS — N750 Cyst of Bartholin's gland: Secondary | ICD-10-CM | POA: Diagnosis not present

## 2016-02-26 MED ORDER — MUPIROCIN CALCIUM 2 % EX CREA
1.0000 | TOPICAL_CREAM | Freq: Two times a day (BID) | CUTANEOUS | 0 refills | Status: DC
Start: 2016-02-26 — End: 2016-08-23
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
## 2016-02-26 NOTE — ED Triage Notes (Signed)
Patient concerned for one, possibly two abscess to labia.  Patient has no history of this.  Denies fever.

## 2016-02-26 NOTE — Discharge Instructions (Signed)
Follow up with your gyn if it continues to bother you, or if it starts to swell and get worse go to [facility] for evaluation.

## 2016-03-31 IMAGING — CR DG WRIST COMPLETE 3+V*L*
2 series · 2 of 2 positions shown · non-contrast
Comparison: None.

CLINICAL DATA: Left wrist pain

EXAM:
LEFT WRIST - COMPLETE 3+ VIEW

[view not recorded (1 of 2)]
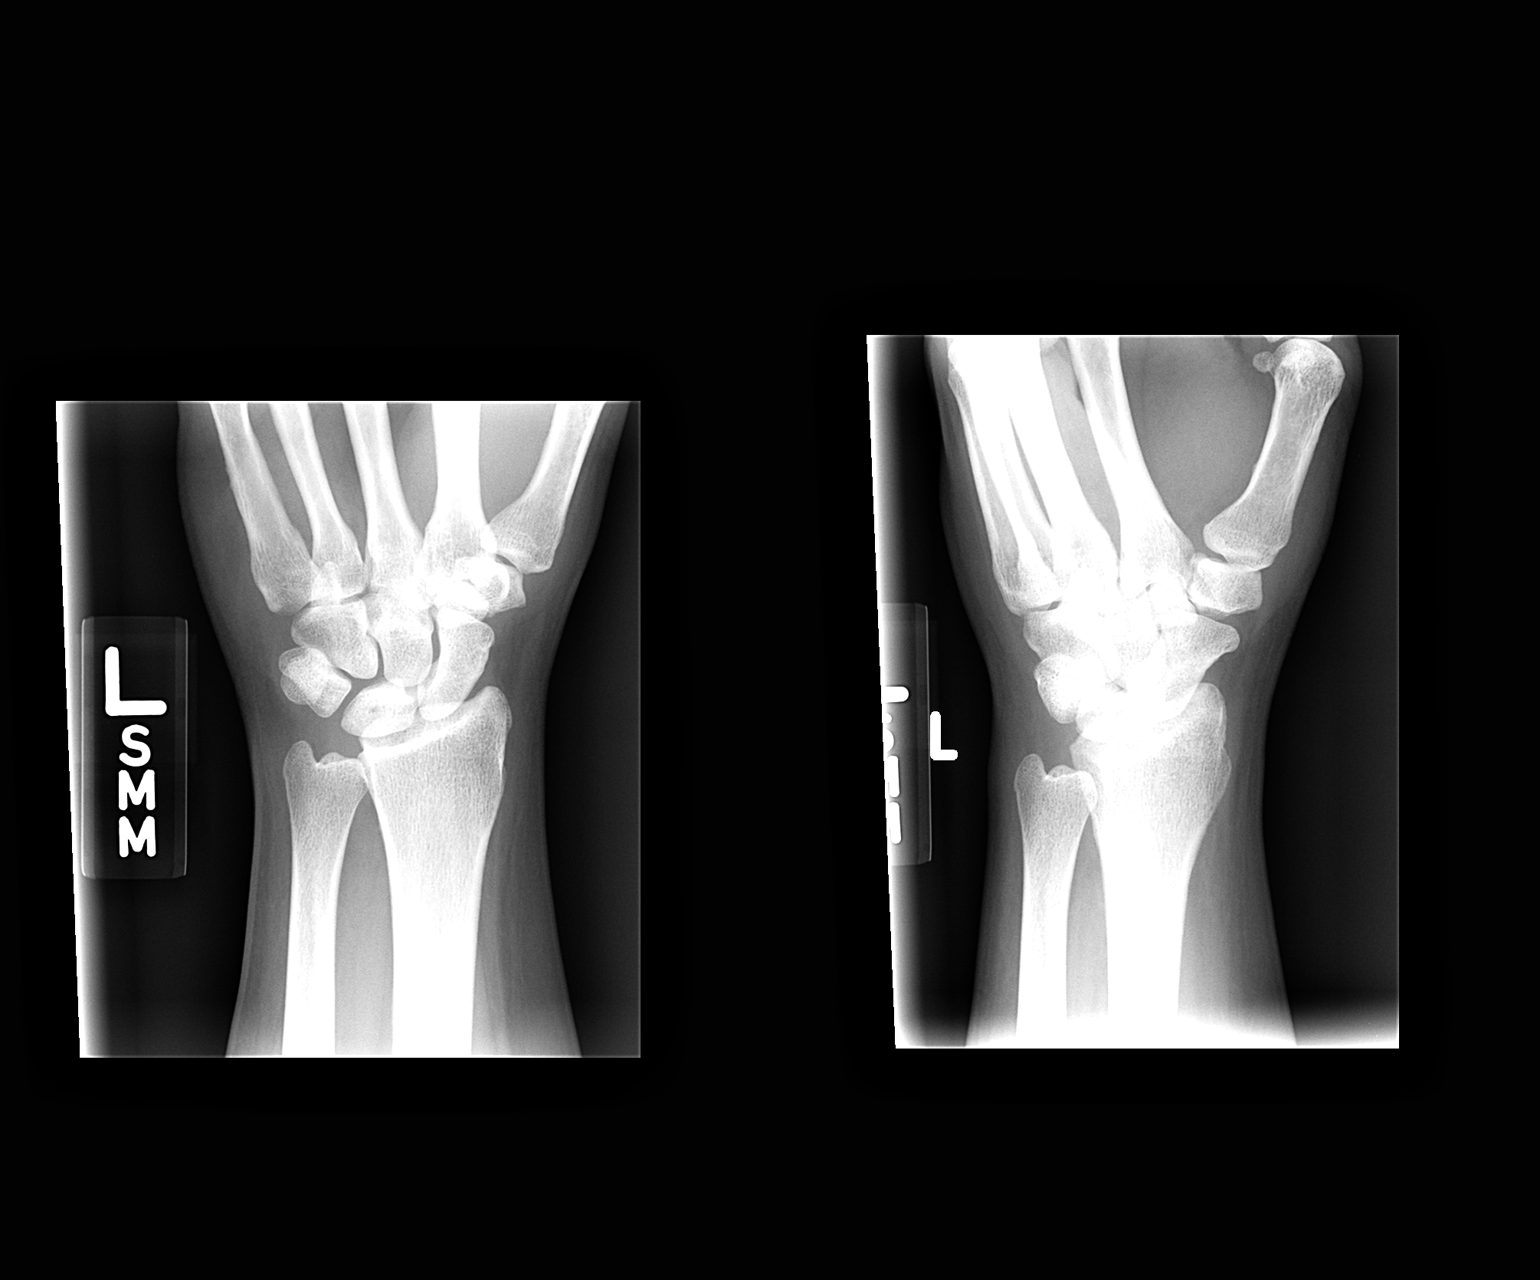

[view not recorded (2 of 2)]
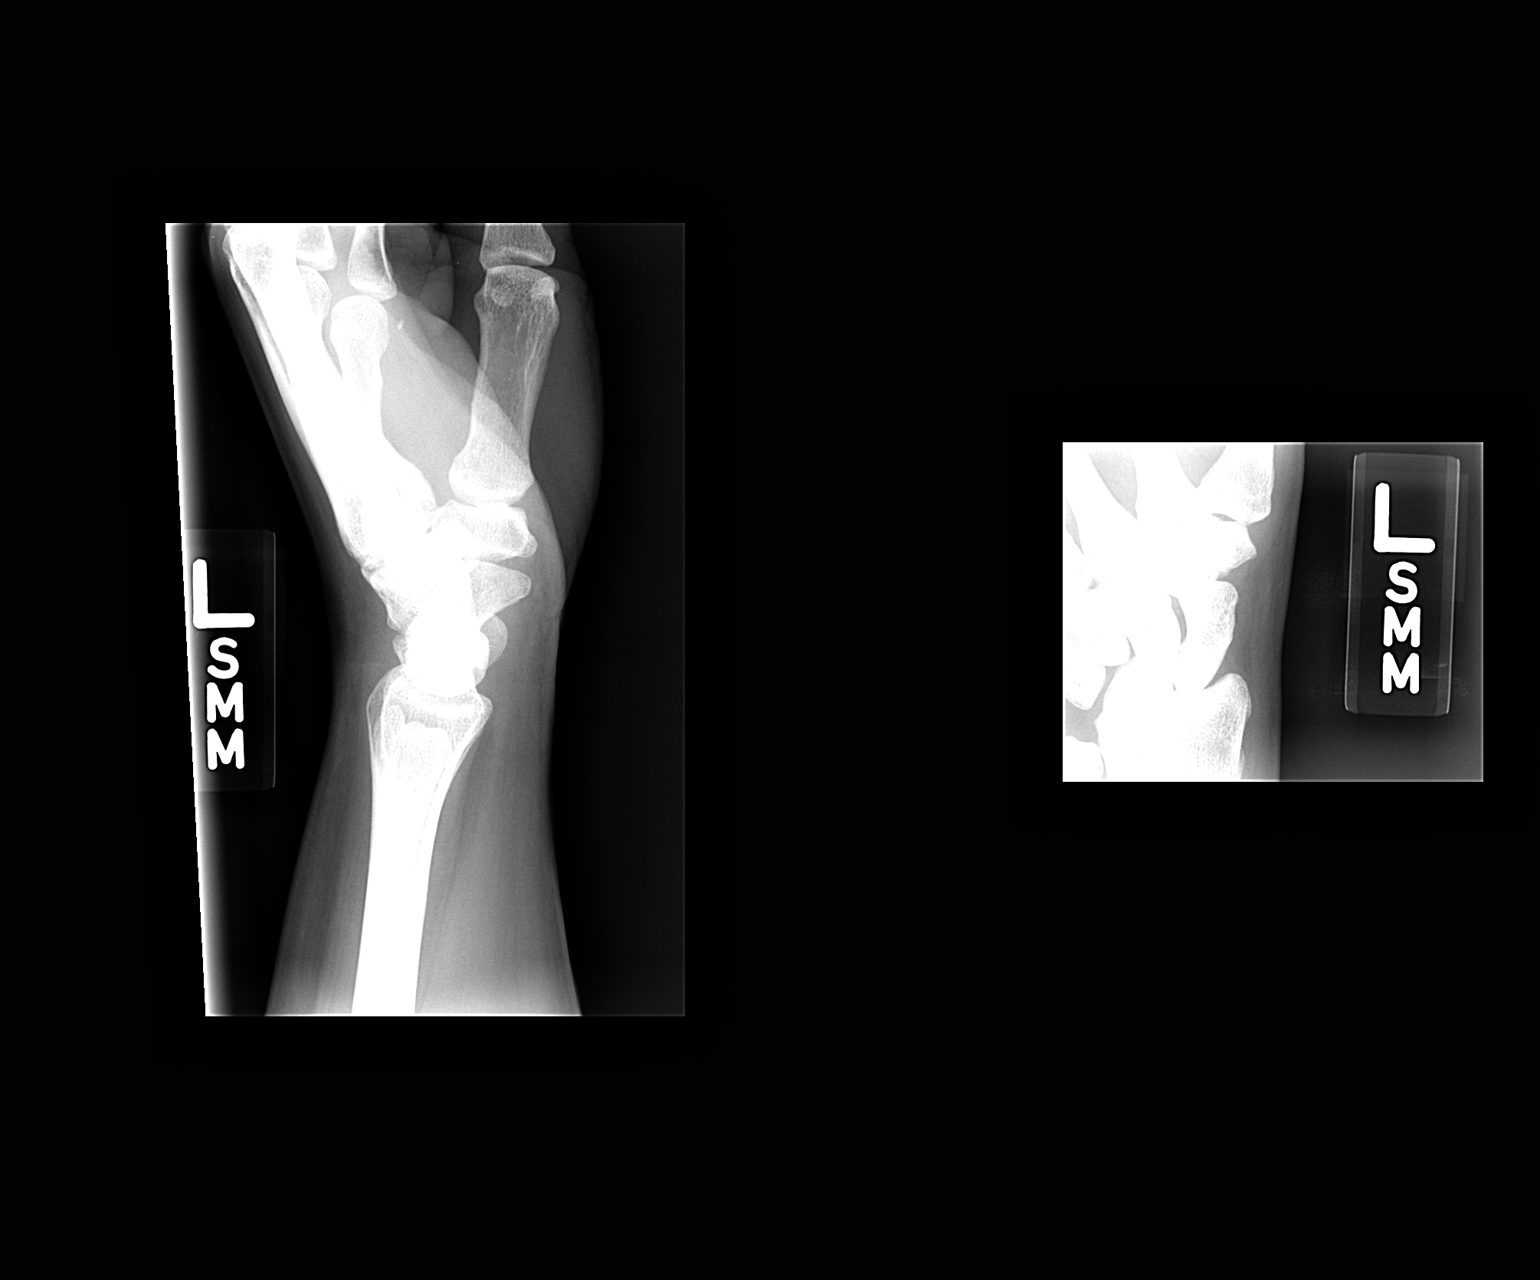

[2 of 2 positions shown; findings below may reference images not displayed]

FINDINGS: There is no evidence of fracture or dislocation. There is no
evidence of arthropathy or other focal bone abnormality. Soft
tissues are unremarkable.
IMPRESSION: Negative.

## 2016-04-18 ENCOUNTER — Emergency Department (HOSPITAL_COMMUNITY)
Admission: EM | Admit: 2016-04-18 | Discharge: 2016-04-19 | Disposition: A | Discharge status: Home / Self Care | Payer: Medicaid Other | Attending: Emergency Medicine | Admitting: Emergency Medicine

## 2016-04-18 ENCOUNTER — Encounter (HOSPITAL_COMMUNITY): Payer: Self-pay | Admitting: Emergency Medicine

## 2016-04-18 DIAGNOSIS — R600 Localized edema: Secondary | ICD-10-CM | POA: Diagnosis not present

## 2016-04-18 DIAGNOSIS — R609 Edema, unspecified: Secondary | ICD-10-CM

## 2016-04-18 DIAGNOSIS — F17210 Nicotine dependence, cigarettes, uncomplicated: Secondary | ICD-10-CM | POA: Insufficient documentation

## 2016-04-18 DIAGNOSIS — I213 ST elevation (STEMI) myocardial infarction of unspecified site: Secondary | ICD-10-CM | POA: Diagnosis not present

## 2016-04-18 DIAGNOSIS — M7989 Other specified soft tissue disorders: Secondary | ICD-10-CM | POA: Diagnosis present

## 2016-04-18 LAB — BASIC METABOLIC PANEL
Anion gap: 7 (ref 5–15)
BUN: 7 mg/dL (ref 6–20)
CO2: 22 mmol/L (ref 22–32)
Calcium: 8.9 mg/dL (ref 8.9–10.3)
Chloride: 110 mmol/L (ref 101–111)
Creatinine, Ser: 0.78 mg/dL (ref 0.44–1.00)
GFR calc Af Amer: >60 mL/min (ref >60)
GFR calc non Af Amer: >60 mL/min (ref >60)
Glucose, Bld: 92 mg/dL (ref 65–99)
Potassium: 3.8 mmol/L (ref 3.5–5.1)
Sodium: 139 mmol/L (ref 135–145)

## 2016-04-18 LAB — CBC WITH DIFFERENTIAL/PLATELET
Basophils Absolute: 0 10*3/uL (ref 0.0–0.1)
Basophils Relative: 1 %
Eosinophils Absolute: 0.1 10*3/uL (ref 0.0–0.7)
Eosinophils Relative: 3 %
HCT: 35.1 % — ABNORMAL LOW (ref 36.0–46.0)
Hemoglobin: 11.4 g/dL — ABNORMAL LOW (ref 12.0–15.0)
Lymphocytes Relative: 54 %
Lymphs Abs: 2.7 10*3/uL (ref 0.7–4.0)
MCH: 31.8 pg (ref 26.0–34.0)
MCHC: 32.5 g/dL (ref 30.0–36.0)
MCV: 97.8 fL (ref 78.0–100.0)
Monocytes Absolute: 0.4 10*3/uL (ref 0.1–1.0)
Monocytes Relative: 9 %
Neutro Abs: 1.6 10*3/uL — ABNORMAL LOW (ref 1.7–7.7)
Neutrophils Relative %: 33 %
Platelets: 292 10*3/uL (ref 150–400)
RBC: 3.59 MIL/uL — ABNORMAL LOW (ref 3.87–5.11)
RDW: 13.4 % (ref 11.5–15.5)
WBC: 4.9 10*3/uL (ref 4.0–10.5)

## 2016-04-18 LAB — POC URINE PREG, ED: PREG TEST UR: NEGATIVE

## 2016-04-18 NOTE — ED Triage Notes (Signed)
Pt c/o 6/10 bilateral frontal HA and increase swollen on bilateral legs, pt states she feels like her whole body is getting swollen and discomfort.

## 2016-04-18 NOTE — ED Notes (Signed)
Pt reports increased swelling to lower and upper extremities and abdomen. States her shoes don't fit her anymore. Reports she noted drastic swelling in the mirror the other day.

## 2016-04-18 NOTE — ED Notes (Signed)
Pt called for room.  No answer x2

## 2016-04-18 NOTE — ED Provider Notes (Signed)
By signing my name below, I, Avnee Patel, attest that this documentation has been prepared under the direction and in the presence of [place], DO  Electronically Signed: Delton [place], ED Scribe. 04/18/16. 12:19 AM.  TIME SEEN: 12:08 AM  CHIEF COMPLAINT:  Chief Complaint  Patient presents with  . Leg Pain    swollen  . Headache    HPI:  Julia Newman is a 41 y.o. female, with no significant PMHx, who presents to the Emergency Department complaining of worsening bilateral lower extremity swelling onset mid 11/2015.  He states that this episode occurred after prolonged period of standing for 3 days in a row and resolved after keeping her legs elevated at rest. States over the past several days she noticed that this has come back and is worse.  States she feels like her legs, arms and now abdomen are swollen and feel "tight". Pt denies chest pain, SOB or any other associated symptoms. Pt is a smoker. She does not have a history of CHF, liver failure, renal failure. Not on diuretics.  ROS: See HPI Constitutional: no fever  Eyes: no drainage  ENT: no runny nose   Cardiovascular:  no chest pain  Resp: no SOB  GI: no vomiting GU: no dysuria Integumentary: no rash  Allergy: no hives  Musculoskeletal: no leg swelling  Neurological: no slurred speech ROS otherwise negative  PAST MEDICAL HISTORY/PAST SURGICAL HISTORY:  Past Medical History:  Diagnosis Date  . Arthritis    knees  . Depression   . Hx MRSA infection 2006 or 2007   face and thigh  . Intraductal papilloma of right breast 07/2013  . Panic attacks     MEDICATIONS:  Prior to Admission medications   Medication Sig Start Date End Date Taking? Authorizing Provider  diazepam (VALIUM) 5 MG tablet Take 1 tablet (5 mg total) by mouth every 12 (twelve) hours as needed for anxiety. 10/11/15   Josalyn Funches, MD  Divalproex Sodium (DEPAKOTE PO) Take 1 tablet by mouth daily.    Historical Provider, MD  mupirocin cream  (BACTROBAN) 2 % Apply 1 application topically 2 (two) times daily. 02/26/16   Nehemiah Settle, NP  RisperiDONE (RISPERDAL PO) Take 1 tablet by mouth daily.    Historical Provider, MD    ALLERGIES:  No Known Allergies  SOCIAL HISTORY:  Social History  Substance Use Topics  . Smoking status: Current Every Day Smoker    Packs/day: 1.00    Years: 12.00    Types: Cigarettes  . Smokeless tobacco: Never Used  . Alcohol use 0.0 oz/week     Comment: Once every 6 months.     FAMILY HISTORY: History reviewed. No pertinent family history.  EXAM: BP 123/76 (BP Location: Right Arm)   Pulse (!) 57   Temp 97.9 F (36.6 C) (Oral)   Resp 18   Ht 5\' 3"  (1.6 m)   Wt 220 lb (99.8 kg)   LMP 04/14/2016   SpO2 100%   BMI 38.97 kg/m  CONSTITUTIONAL: Alert and oriented and responds appropriately to questions. Well-appearing; well-nourished HEAD: Normocephalic EYES: Conjunctivae clear, PERRL, EOMI ENT: normal nose; no rhinorrhea; moist mucous membranes NECK: Supple, no meningismus, no nuchal rigidity, no LAD. No JVD CARD: RRR; S1 and S2 appreciated; no murmurs, no clicks, no rubs, no gallops RESP: Normal chest excursion without splinting or tachypnea; breath sounds clear and equal bilaterally; no wheezes, no rhonchi, no rales, no hypoxia or respiratory distress, speaking full sentences ABD/GI: Normal bowel  sounds; non-distended; soft, non-tender, no rebound, no guarding, no peritoneal signs, no hepatosplenomegaly, No fluid wave BACK:  The back appears normal and is non-tender to palpation, there is no CVA tenderness EXT: Normal ROM in all joints; non-tender to palpation; no edema; normal capillary refill; no cyanosis, no calf tenderness or swelling    SKIN: Normal color for age and race; warm; no rash NEURO: Moves all extremities equally, sensation to light touch intact diffusely, cranial nerves II through XII intact, normal speech PSYCH: The patient's mood and manner are appropriate. Grooming  and personal hygiene are appropriate.  MEDICAL DECISION MAKING: Patient here complains of feeling like her arms, legs and abdomen are swollen. There is no appreciable edema on exam. Lungs are clear and she has no chest pain or shortness of breath. No JVD. Labs show no renal failure, signs of liver failure. Troponin and BNP normal. She is not pregnant. Urinalysis pending. She is hemodynamically stable. Will give small dose of IV Lasix for symptomatic relief but doubt CHF.  Recommended close outpatient follow-up.  ED PROGRESS: 1:40 AM  Pt's workup today has been unremarkable. Given IV Lasix and she is now urinating and states she is early feeling better. Urinalysis to show blood, leukocytes with rare bacteria. She does have some proteinuria. She is currently on her menstrual cycle which is likely the cause of her hematuria and pyuria. She has no urinary symptoms. Have discussed this proteinuria with patient recommended close follow-up with her PCP. I do not feel she needs to go home on diuretics. Discussed with her return precautions including chest pain, shortness of breath. She is comfortable with this plan.   At this time, I do not feel there is any life-threatening condition present. I have reviewed and discussed all results (EKG, imaging, lab, urine as appropriate) and exam findings with patient/family. I have reviewed nursing notes and appropriate previous records.  I feel the patient is safe to be discharged home without further emergent workup and can continue workup as an outpatient as needed. Discussed usual and customary return precautions. Patient/family verbalize understanding and are comfortable with this plan.  Outpatient follow-up has been provided. All questions have been answered.    EKG Interpretation  Date/Time:  Thursday April 19 2016 00:00:43 EST Ventricular Rate:  61 PR Interval:    QRS Duration: 92 QT Interval:  407 QTC Calculation: 410 R Axis:   65 Text Interpretation:   Sinus arrhythmia ST elev, probable normal early repol pattern No significant change since last tracing Confirmed by Binta Statzer,  DO, Zayley Arras [phone]) on 04/19/2016 12:04:16 AM       I personally performed the services described in this documentation, which was scribed in my presence. The recorded information has been reviewed and is accurate.     [place], DO 04/19/16 [phone]

## 2016-04-19 LAB — URINALYSIS, ROUTINE W REFLEX MICROSCOPIC
BILIRUBIN URINE: NEGATIVE
GLUCOSE, UA: NEGATIVE mg/dL
KETONES UR: NEGATIVE mg/dL
Nitrite: NEGATIVE
PH: 5 (ref 5.0–8.0)
Protein, ur: 30 mg/dL — AB
SPECIFIC GRAVITY, URINE: 1.02 (ref 1.005–1.030)

## 2016-04-19 LAB — HEPATIC FUNCTION PANEL
ALT: 13 U/L — ABNORMAL LOW (ref 14–54)
AST: 16 U/L (ref 15–41)
Albumin: 3.4 g/dL — ABNORMAL LOW (ref 3.5–5.0)
Alkaline Phosphatase: 50 U/L (ref 38–126)
BILIRUBIN INDIRECT: 0.5 mg/dL (ref 0.3–0.9)
Bilirubin, Direct: 0.1 mg/dL (ref 0.1–0.5)
Total Bilirubin: 0.6 mg/dL (ref 0.3–1.2)
Total Protein: 6.9 g/dL (ref 6.5–8.1)

## 2016-04-19 LAB — I-STAT TROPONIN, ED: Troponin i, poc: 0 ng/mL (ref 0.00–0.08)

## 2016-04-19 LAB — BRAIN NATRIURETIC PEPTIDE: B NATRIURETIC PEPTIDE 5: 43.3 pg/mL (ref 0.0–100.0)

## 2016-04-19 MED ORDER — FUROSEMIDE 10 MG/ML IJ SOLN
20.0000 mg | Freq: Once | INTRAMUSCULAR | Status: AC
  Administered 2016-04-19: 20 mg via INTRAVENOUS
  Filled 2016-04-19: qty 2

## 2016-04-19 NOTE — ED Notes (Signed)
Patient left at this time with all belongings. 

## 2016-04-19 NOTE — ED Notes (Signed)
ED Provider at bedside. 

## 2016-07-26 ENCOUNTER — Ambulatory Visit: Payer: Self-pay | Admitting: Family Medicine

## 2016-07-31 ENCOUNTER — Ambulatory Visit: Payer: Federal, State, Local not specified - Other | Attending: Internal Medicine

## 2016-08-23 ENCOUNTER — Encounter: Payer: Self-pay | Admitting: Family Medicine

## 2016-08-23 ENCOUNTER — Ambulatory Visit: Payer: Self-pay | Attending: Family Medicine | Admitting: Family Medicine

## 2016-08-23 VITALS — BP 104/66 | HR 84 | Temp 98.4°F | Wt 226.6 lb

## 2016-08-23 DIAGNOSIS — R1900 Intra-abdominal and pelvic swelling, mass and lump, unspecified site: Secondary | ICD-10-CM

## 2016-08-23 DIAGNOSIS — F250 Schizoaffective disorder, bipolar type: Secondary | ICD-10-CM | POA: Insufficient documentation

## 2016-08-23 DIAGNOSIS — M109 Gout, unspecified: Secondary | ICD-10-CM | POA: Insufficient documentation

## 2016-08-23 DIAGNOSIS — M25562 Pain in left knee: Secondary | ICD-10-CM | POA: Insufficient documentation

## 2016-08-23 DIAGNOSIS — N898 Other specified noninflammatory disorders of vagina: Secondary | ICD-10-CM | POA: Insufficient documentation

## 2016-08-23 DIAGNOSIS — M25511 Pain in right shoulder: Secondary | ICD-10-CM | POA: Insufficient documentation

## 2016-08-23 DIAGNOSIS — F17210 Nicotine dependence, cigarettes, uncomplicated: Secondary | ICD-10-CM | POA: Insufficient documentation

## 2016-08-23 DIAGNOSIS — M7989 Other specified soft tissue disorders: Secondary | ICD-10-CM | POA: Insufficient documentation

## 2016-08-23 DIAGNOSIS — R768 Other specified abnormal immunological findings in serum: Secondary | ICD-10-CM

## 2016-08-23 LAB — POCT GLYCOSYLATED HEMOGLOBIN (HGB A1C): Hemoglobin A1C: 5.6
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
## 2016-08-23 NOTE — Patient Instructions (Addendum)
Julia Newman was seen today for knee pain.  Diagnoses and all orders for this visit:  Foot swelling -     Uric Acid -     ANA,IFA RA Diag Pnl w/rflx Tit/Patn  Schizoaffective disorder, bipolar type (HCC) -     POCT glycosylated hemoglobin (Hb A1C) -     Lipid Panel -     CMP14+EGFR -     CBC  Vaginal discharge -     Cervicovaginal ancillary only  Pelvic fullness in female -     US Pelvis Complete; Future -     US Transvaginal Non-OB; Future   F/u in 2-3 weeks to review labs and studies   Dr. Adrian Blackwater

## 2016-08-24 DIAGNOSIS — M7989 Other specified soft tissue disorders: Secondary | ICD-10-CM | POA: Insufficient documentation

## 2016-08-24 DIAGNOSIS — N898 Other specified noninflammatory disorders of vagina: Secondary | ICD-10-CM | POA: Insufficient documentation

## 2016-08-24 DIAGNOSIS — R1900 Intra-abdominal and pelvic swelling, mass and lump, unspecified site: Secondary | ICD-10-CM | POA: Insufficient documentation

## 2016-08-24 NOTE — Assessment & Plan Note (Addendum)
Pelvic and TVUS ordered  Pelvic and transvaginal ultrasound reveal thickening of the uterine lining and a mass about 1 inches wide in the lining of the uterus The recommendation of follow up ultrasound with fluid inserted into the uterus through the cervix to get more detailed images abd better define the mass, this is called a sonohysterogram and  done by the gynecologist Gynecology referral placed

## 2016-08-24 NOTE — Assessment & Plan Note (Signed)
A: followed by mental health. Has gained weight on Risperdal  P: Check CBC, CMP, lipids and A1c

## 2016-08-24 NOTE — Assessment & Plan Note (Signed)
Swelling in feet with joint pains ANA, RF, uric acid ordered

## 2016-08-24 NOTE — Assessment & Plan Note (Addendum)
Wet prep done  Bacterial vaginosis (BV) on wet prep This is not an STD This is an overgrowth of bacteria that can be treated with flagyl 500 mg twice daily followed by difucan 150 mg once to prevent yeast.  I have sent these to your pharmacy Do not mix flagyl with alcohol as this will cause stomach upset

## 2016-08-25 LAB — LIPID PANEL
CHOL/HDL RATIO: 2.9 ratio (ref 0.0–4.4)
Cholesterol, Total: 161 mg/dL (ref 100–199)
HDL: 56 mg/dL (ref >39)
LDL CALC: 93 mg/dL (ref 0–99)
Triglycerides: 62 mg/dL (ref 0–149)
VLDL Cholesterol Cal: 12 mg/dL (ref 5–40)

## 2016-08-25 LAB — CMP14+EGFR
ALT: 11 IU/L (ref 0–32)
AST: 17 IU/L (ref 0–40)
Albumin/Globulin Ratio: 1.5 (ref 1.2–2.2)
Albumin: 4.1 g/dL (ref 3.5–5.5)
Alkaline Phosphatase: 58 IU/L (ref 39–117)
BILIRUBIN TOTAL: 0.5 mg/dL (ref 0.0–1.2)
BUN/Creatinine Ratio: 9 (ref 9–23)
BUN: 8 mg/dL (ref 6–24)
CALCIUM: 8.9 mg/dL (ref 8.7–10.2)
CHLORIDE: 107 mmol/L — AB (ref 96–106)
CO2: 21 mmol/L (ref 20–29)
Creatinine, Ser: 0.86 mg/dL (ref 0.57–1.00)
GFR, EST AFRICAN AMERICAN: 96 mL/min/{1.73_m2} (ref >59)
GFR, EST NON AFRICAN AMERICAN: 84 mL/min/{1.73_m2} (ref >59)
GLUCOSE: 100 mg/dL — AB (ref 65–99)
Globulin, Total: 2.8 g/dL (ref 1.5–4.5)
Potassium: 4.1 mmol/L (ref 3.5–5.2)
Sodium: 141 mmol/L (ref 134–144)
TOTAL PROTEIN: 6.9 g/dL (ref 6.0–8.5)

## 2016-08-25 LAB — FANA STAINING PATTERNS

## 2016-08-25 LAB — CBC
HEMATOCRIT: 35.1 % (ref 34.0–46.6)
HEMOGLOBIN: 11.3 g/dL (ref 11.1–15.9)
MCH: 32 pg (ref 26.6–33.0)
MCHC: 32.2 g/dL (ref 31.5–35.7)
MCV: 99 fL — ABNORMAL HIGH (ref 79–97)
Platelets: 279 10*3/uL (ref 150–379)
RBC: 3.53 x10E6/uL — AB (ref 3.77–5.28)
RDW: 14.6 % (ref 12.3–15.4)
WBC: 5.5 10*3/uL (ref 3.4–10.8)

## 2016-08-25 LAB — ANA,IFA RA DIAG PNL W/RFLX TIT/PATN
ANA Titer 1: POSITIVE — AB
CYCLIC CITRULLIN PEPTIDE AB: 23 U — AB (ref 0–19)

## 2016-08-25 LAB — URIC ACID: URIC ACID: 5 mg/dL (ref 2.5–7.1)

## 2016-08-27 LAB — CERVICOVAGINAL ANCILLARY ONLY: Wet Prep (BD Affirm): POSITIVE — AB

## 2016-08-30 ENCOUNTER — Telehealth: Payer: Self-pay

## 2016-08-30 NOTE — Telephone Encounter (Signed)
Pt was called and informed of lab results. 

## 2016-09-02 DIAGNOSIS — R768 Other specified abnormal immunological findings in serum: Secondary | ICD-10-CM | POA: Insufficient documentation

## 2016-09-02 MED ORDER — METRONIDAZOLE 500 MG PO TABS: 500.0000 mg | ORAL_TABLET | Freq: Two times a day (BID) | ORAL | 0 refills | Status: AC

## 2016-09-02 MED ORDER — FLUCONAZOLE 150 MG PO TABS: 150.0000 mg | ORAL_TABLET | Freq: Once | ORAL | 0 refills | Status: AC

## 2016-09-02 NOTE — Assessment & Plan Note (Signed)
ANA is positive with pattern concerning for lupus, referral to rheumatologist placed

## 2016-09-02 NOTE — Addendum Note (Signed)
Addended by: Boykin Nearing on: 09/02/2016 09:14 PM   Modules accepted: Orders

## 2016-09-05 ENCOUNTER — Ambulatory Visit (HOSPITAL_COMMUNITY)
Admission: RE | Admit: 2016-09-05 | Discharge: 2016-09-05 | Disposition: A | Discharge status: Home / Self Care | Payer: Medicaid Other | Source: Ambulatory Visit | Attending: Family Medicine | Admitting: Family Medicine

## 2016-09-05 ENCOUNTER — Telehealth: Payer: Self-pay

## 2016-09-05 DIAGNOSIS — R1900 Intra-abdominal and pelvic swelling, mass and lump, unspecified site: Secondary | ICD-10-CM | POA: Insufficient documentation

## 2016-09-05 DIAGNOSIS — R938 Abnormal findings on diagnostic imaging of other specified body structures: Secondary | ICD-10-CM | POA: Insufficient documentation

## 2016-09-05 NOTE — Addendum Note (Signed)
Addended by: Boykin Nearing on: 09/05/2016 08:23 PM   Modules accepted: Orders

## 2016-09-05 NOTE — Telephone Encounter (Signed)
Pt was called and informed of lab results. 

## 2016-09-07 ENCOUNTER — Encounter: Payer: Self-pay | Admitting: Family Medicine

## 2016-09-07 ENCOUNTER — Ambulatory Visit: Payer: Self-pay | Attending: Family Medicine | Admitting: Family Medicine

## 2016-09-07 VITALS — BP 104/63 | HR 70 | Temp 98.2°F | Ht 63.0 in | Wt 228.0 lb

## 2016-09-07 DIAGNOSIS — R768 Other specified abnormal immunological findings in serum: Secondary | ICD-10-CM

## 2016-09-07 DIAGNOSIS — R760 Raised antibody titer: Secondary | ICD-10-CM | POA: Insufficient documentation

## 2016-09-07 DIAGNOSIS — M25562 Pain in left knee: Secondary | ICD-10-CM | POA: Insufficient documentation

## 2016-09-07 LAB — POCT URINALYSIS DIPSTICK
BILIRUBIN UA: NEGATIVE
Blood, UA: NEGATIVE
GLUCOSE UA: NEGATIVE
KETONES UA: NEGATIVE
Leukocytes, UA: NEGATIVE
Nitrite, UA: NEGATIVE
PROTEIN UA: NEGATIVE
SPEC GRAV UA: 1.025 (ref 1.010–1.025)
Urobilinogen, UA: 0.2 E.U./dL
pH, UA: 6 (ref 5.0–8.0)

## 2016-09-07 LAB — POCT UA - MICROALBUMIN
Albumin/Creatinine Ratio, Urine, POC: <30
Creatinine, POC: 200 mg/dL
Microalbumin Ur, POC: 10 mg/L

## 2016-09-07 MED ORDER — METHYLPREDNISOLONE ACETATE 40 MG/ML IJ SUSP
40.0000 mg | Freq: Once | INTRAMUSCULAR | Status: AC
  Administered 2016-09-07: 40 mg via INTRA_ARTICULAR

## 2016-09-07 NOTE — Patient Instructions (Addendum)
Diagnoses and all orders for this visit:  Elevated antinuclear antibody (ANA) level -     POCT urinalysis dipstick -     Ambulatory referral to Rheumatology  Acute pain of left knee -     methylPREDNISolone acetate (DEPO-MEDROL) injection 40 mg; Inject 1 mL (40 mg total) into the articular space once.  You have received a shot of steroid in your joint today. Rest and ice knee today. Regular activity tomorrow. Look out for redness, swelling, fever,severe pain in joint and call if you experience these symptoms.  Your orange card and [place] discount will help cover the referrals  You will be called with gynecology and rheumatology appointments   F/u in 6 weeks to follow up elevated ANA concerning for lupus and for f/u gynecology visit return sooner if needed   Dr. Adrian Blackwater
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
## 2016-09-12 NOTE — Assessment & Plan Note (Signed)
ANA is positive with pattern concerning for lupus, referral to rheumatologist placed

## 2016-09-12 NOTE — Assessment & Plan Note (Signed)
Acute pain with mild swelling In setting of possible lupus Plan: Left knee corticosteroid injection done today

## 2016-10-19 ENCOUNTER — Encounter: Payer: Self-pay | Admitting: Family Medicine

## 2016-10-26 ENCOUNTER — Ambulatory Visit: Payer: Self-pay | Attending: Internal Medicine | Admitting: Internal Medicine

## 2016-10-26 ENCOUNTER — Encounter: Payer: Self-pay | Admitting: Internal Medicine

## 2016-10-26 VITALS — BP 115/77 | HR 63 | Temp 98.6°F | Resp 16 | Wt 237.4 lb

## 2016-10-26 DIAGNOSIS — Z9889 Other specified postprocedural states: Secondary | ICD-10-CM | POA: Insufficient documentation

## 2016-10-26 DIAGNOSIS — F259 Schizoaffective disorder, unspecified: Secondary | ICD-10-CM | POA: Insufficient documentation

## 2016-10-26 DIAGNOSIS — F17210 Nicotine dependence, cigarettes, uncomplicated: Secondary | ICD-10-CM | POA: Insufficient documentation

## 2016-10-26 DIAGNOSIS — M2550 Pain in unspecified joint: Secondary | ICD-10-CM

## 2016-10-26 DIAGNOSIS — N898 Other specified noninflammatory disorders of vagina: Secondary | ICD-10-CM

## 2016-10-26 DIAGNOSIS — R1900 Intra-abdominal and pelvic swelling, mass and lump, unspecified site: Secondary | ICD-10-CM

## 2016-10-26 DIAGNOSIS — M7989 Other specified soft tissue disorders: Secondary | ICD-10-CM

## 2016-10-26 MED ORDER — FUROSEMIDE 20 MG PO TABS: 10.0000 mg | ORAL_TABLET | Freq: Every day | ORAL | 1 refills | Status: DC | PRN
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
## 2016-10-26 NOTE — Patient Instructions (Signed)
Use the Furosemide as needed for swelling.

## 2016-11-01 ENCOUNTER — Other Ambulatory Visit: Payer: Self-pay | Admitting: Internal Medicine

## 2016-11-01 LAB — CERVICOVAGINAL ANCILLARY ONLY
BACTERIAL VAGINITIS: POSITIVE — AB
CHLAMYDIA, DNA PROBE: NEGATIVE
NEISSERIA GONORRHEA: NEGATIVE
TRICH (WINDOWPATH): NEGATIVE

## 2016-11-01 MED ORDER — CLINDAMYCIN HCL 300 MG PO CAPS: 300.0000 mg | ORAL_CAPSULE | Freq: Two times a day (BID) | ORAL | 0 refills | Status: DC

## 2016-11-01 NOTE — Progress Notes (Unsigned)
clindamycin

## 2016-11-02 MED FILL — CLINDAMYCIN HCL 300 MG CAP: 300 | 7 days supply | Qty: 14 | Fill #0

## 2016-11-08 ENCOUNTER — Encounter: Payer: Self-pay | Admitting: Obstetrics & Gynecology

## 2016-11-08 ENCOUNTER — Ambulatory Visit (INDEPENDENT_AMBULATORY_CARE_PROVIDER_SITE_OTHER): Payer: Self-pay | Admitting: Obstetrics & Gynecology

## 2016-11-08 VITALS — BP 99/62 | HR 69 | Wt 234.5 lb

## 2016-11-08 DIAGNOSIS — N760 Acute vaginitis: Secondary | ICD-10-CM

## 2016-11-08 DIAGNOSIS — B9689 Other specified bacterial agents as the cause of diseases classified elsewhere: Secondary | ICD-10-CM

## 2016-11-08 MED ORDER — TINIDAZOLE 500 MG PO TABS: 2.0000 g | ORAL_TABLET | Freq: Every day | ORAL | 0 refills | Status: DC

## 2016-11-08 NOTE — Progress Notes (Signed)
History:  41 y.o. [id number] here today for vaginal discharge and oror.  Pt reports mult episodes of BV. She reports that she was treated with Flagyl with no change in her sx. Her cx from 10/26/2016 were neg.   The following portions of the patient's history were reviewed and updated as appropriate: allergies, current medications, past family history, past medical history, past social history, past surgical history and problem list.  Review of Systems:  Pertinent items are noted in HPI.   Objective:  Physical Exam Blood pressure 99/62, pulse 69, weight 234 lb 8 oz (106.4 kg), last menstrual period 10/15/2016.  CONSTITUTIONAL: Well-developed, well-nourished female in no acute distress.  HENT:  Normocephalic, atraumatic EYES: Conjunctivae and EOM are normal. No scleral icterus.  NECK: Normal range of motion SKIN: Skin is warm and dry. No rash noted. Not diaphoretic.No pallor. [place]: Alert and oriented to person, place, and time. Normal coordination.  Abd: Soft, nontender and nondistended Pelvic: Normal appearing external genitalia; normal appearing vaginal mucosa and cervix.  Normal discharge.  Small uterus, no other palpable masses, no uterine or adnexal tenderness  Labs and Imaging No results found.  Assessment & Plan:  1. BV (bacterial vaginosis) - tinidazole (TINDAMAX) 500 MG tablet; Take 4 tablets (2,000 mg total) by mouth daily with breakfast. For seven days  Dispense: 28 tablet; Refill: 0  F/u prn  Nox Talent L. Harraway-Smith, M.D., Cherlynn June

## 2016-11-08 NOTE — Progress Notes (Signed)
Patient did not take Clindamycin!  Pap Smear 07/28/2015

## 2016-11-08 NOTE — Patient Instructions (Signed)
GO WHITE: Soap: UNSCENTED Dove (white box light green writing) Laundry detergent (underwear)- Dreft or [employer] unscented WHITE 100% cotton panties (NOT just cotton crouch) Sanitary napkin/panty liners: UNSCENTED.  If it doesn't SAY unscented it can have a scent/perfume    NO PERFUMES OR LOTIONS OR POTIONS in the vulvar area (may use regular KY) Condoms: hypoallergenic only. Non dyed (no color) Toilet papers: white only Wash clothes: use a separate wash cloth. WHITE.  Washed in Dreft.  

## 2016-11-09 ENCOUNTER — Encounter: Payer: Self-pay | Admitting: Obstetrics & Gynecology

## 2016-11-12 MED FILL — TINIDAZOLE 500 MG TABS: 500 | 7 days supply | Qty: 28 | Fill #0

## 2017-02-22 ENCOUNTER — Encounter: Payer: Self-pay | Admitting: Internal Medicine

## 2017-02-22 ENCOUNTER — Ambulatory Visit: Payer: Self-pay | Attending: Internal Medicine | Admitting: Internal Medicine

## 2017-02-22 VITALS — BP 101/68 | HR 77 | Temp 98.5°F | Resp 16 | Wt 238.4 lb

## 2017-02-22 DIAGNOSIS — G8929 Other chronic pain: Secondary | ICD-10-CM | POA: Insufficient documentation

## 2017-02-22 DIAGNOSIS — M25561 Pain in right knee: Secondary | ICD-10-CM | POA: Insufficient documentation

## 2017-02-22 DIAGNOSIS — M25562 Pain in left knee: Secondary | ICD-10-CM | POA: Insufficient documentation

## 2017-02-22 DIAGNOSIS — M7989 Other specified soft tissue disorders: Secondary | ICD-10-CM | POA: Insufficient documentation

## 2017-02-22 DIAGNOSIS — E669 Obesity, unspecified: Secondary | ICD-10-CM | POA: Insufficient documentation

## 2017-02-22 DIAGNOSIS — R768 Other specified abnormal immunological findings in serum: Secondary | ICD-10-CM

## 2017-02-22 DIAGNOSIS — F259 Schizoaffective disorder, unspecified: Secondary | ICD-10-CM | POA: Insufficient documentation

## 2017-02-22 DIAGNOSIS — F419 Anxiety disorder, unspecified: Secondary | ICD-10-CM | POA: Insufficient documentation

## 2017-02-22 DIAGNOSIS — N946 Dysmenorrhea, unspecified: Secondary | ICD-10-CM | POA: Insufficient documentation

## 2017-02-22 DIAGNOSIS — F329 Major depressive disorder, single episode, unspecified: Secondary | ICD-10-CM | POA: Insufficient documentation

## 2017-02-22 DIAGNOSIS — F17210 Nicotine dependence, cigarettes, uncomplicated: Secondary | ICD-10-CM | POA: Insufficient documentation

## 2017-02-22 DIAGNOSIS — R9389 Abnormal findings on diagnostic imaging of other specified body structures: Secondary | ICD-10-CM | POA: Insufficient documentation

## 2017-02-22 DIAGNOSIS — Z79899 Other long term (current) drug therapy: Secondary | ICD-10-CM | POA: Insufficient documentation

## 2017-02-22 DIAGNOSIS — R1900 Intra-abdominal and pelvic swelling, mass and lump, unspecified site: Secondary | ICD-10-CM

## 2017-02-22 DIAGNOSIS — N898 Other specified noninflammatory disorders of vagina: Secondary | ICD-10-CM | POA: Insufficient documentation

## 2017-02-22 MED ORDER — FUROSEMIDE 20 MG PO TABS: 10.0000 mg | ORAL_TABLET | Freq: Every day | ORAL | 1 refills | Status: DC | PRN

## 2017-02-22 MED ORDER — MELOXICAM 15 MG PO TABS: 15.0000 mg | ORAL_TABLET | Freq: Every day | ORAL | 3 refills | Status: DC

## 2017-02-22 NOTE — Progress Notes (Signed)
Pt states her left knee is tight Pt states she doesn't know if she needs another cortisone shot or what

## 2017-02-22 NOTE — Patient Instructions (Signed)
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
## 2017-03-15 ENCOUNTER — Ambulatory Visit: Payer: Medicaid Other | Attending: Internal Medicine

## 2017-03-15 MED FILL — MELOXICAM 15 MG TABLET: 15 | 30 days supply | Qty: 30 | Fill #0

## 2017-03-15 MED FILL — ?FUROSEMIDE 20 MG TABLET: 20 | 40 days supply | Qty: 20 | Fill #0

## 2017-03-18 ENCOUNTER — Encounter: Payer: Self-pay | Admitting: Internal Medicine

## 2017-03-18 ENCOUNTER — Ambulatory Visit: Payer: Self-pay | Attending: Internal Medicine | Admitting: Internal Medicine

## 2017-03-18 VITALS — BP 107/66 | HR 74 | Temp 98.2°F | Resp 18 | Ht 63.0 in | Wt 235.0 lb

## 2017-03-18 DIAGNOSIS — M2550 Pain in unspecified joint: Secondary | ICD-10-CM | POA: Insufficient documentation

## 2017-03-18 DIAGNOSIS — Z79899 Other long term (current) drug therapy: Secondary | ICD-10-CM | POA: Insufficient documentation

## 2017-03-18 DIAGNOSIS — F259 Schizoaffective disorder, unspecified: Secondary | ICD-10-CM | POA: Insufficient documentation

## 2017-03-18 DIAGNOSIS — R053 Chronic cough: Secondary | ICD-10-CM

## 2017-03-18 DIAGNOSIS — Z77120 Contact with and (suspected) exposure to mold (toxic): Secondary | ICD-10-CM | POA: Insufficient documentation

## 2017-03-18 DIAGNOSIS — N859 Noninflammatory disorder of uterus, unspecified: Secondary | ICD-10-CM | POA: Insufficient documentation

## 2017-03-18 DIAGNOSIS — F17200 Nicotine dependence, unspecified, uncomplicated: Secondary | ICD-10-CM

## 2017-03-18 DIAGNOSIS — F418 Other specified anxiety disorders: Secondary | ICD-10-CM | POA: Insufficient documentation

## 2017-03-18 DIAGNOSIS — Z716 Tobacco abuse counseling: Secondary | ICD-10-CM | POA: Insufficient documentation

## 2017-03-18 DIAGNOSIS — R05 Cough: Secondary | ICD-10-CM | POA: Insufficient documentation

## 2017-03-18 DIAGNOSIS — F17210 Nicotine dependence, cigarettes, uncomplicated: Secondary | ICD-10-CM | POA: Insufficient documentation

## 2017-03-18 MED ORDER — LORATADINE 10 MG PO TABS: 10.0000 mg | ORAL_TABLET | Freq: Every day | ORAL | 1 refills | Status: DC

## 2017-03-18 MED ORDER — ALBUTEROL SULFATE HFA 108 (90 BASE) MCG/ACT IN AERS: 2.0000 | INHALATION_SPRAY | RESPIRATORY_TRACT | 2 refills | Status: DC | PRN

## 2017-03-18 MED FILL — !VENTOLIN HFA INHALER: 108 (90 BAS | 17 days supply | Qty: 18 | Fill #0
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
## 2017-03-18 NOTE — Patient Instructions (Addendum)
Try to get rid of all clothing or furniture that have mold.   Take Claritin daily.  Use the Albuterol inhaler every 4 hours as needed.

## 2017-06-18 ENCOUNTER — Ambulatory Visit: Payer: Self-pay | Attending: Internal Medicine | Admitting: Internal Medicine

## 2017-06-18 ENCOUNTER — Encounter: Payer: Self-pay | Admitting: Internal Medicine

## 2017-06-18 VITALS — BP 106/72 | HR 60 | Temp 98.2°F | Resp 16 | Wt 247.0 lb

## 2017-06-18 DIAGNOSIS — G8929 Other chronic pain: Secondary | ICD-10-CM | POA: Insufficient documentation

## 2017-06-18 DIAGNOSIS — F17210 Nicotine dependence, cigarettes, uncomplicated: Secondary | ICD-10-CM | POA: Insufficient documentation

## 2017-06-18 DIAGNOSIS — Z9889 Other specified postprocedural states: Secondary | ICD-10-CM | POA: Insufficient documentation

## 2017-06-18 DIAGNOSIS — Z6841 Body Mass Index (BMI) 40.0 and over, adult: Secondary | ICD-10-CM | POA: Insufficient documentation

## 2017-06-18 DIAGNOSIS — Z87891 Personal history of nicotine dependence: Secondary | ICD-10-CM

## 2017-06-18 DIAGNOSIS — F259 Schizoaffective disorder, unspecified: Secondary | ICD-10-CM | POA: Insufficient documentation

## 2017-06-18 DIAGNOSIS — M25562 Pain in left knee: Secondary | ICD-10-CM | POA: Insufficient documentation

## 2017-06-18 DIAGNOSIS — Z79899 Other long term (current) drug therapy: Secondary | ICD-10-CM | POA: Insufficient documentation

## 2017-06-18 MED ORDER — TOPIRAMATE 25 MG PO TABS: 25.0000 mg | ORAL_TABLET | Freq: Every day | ORAL | 1 refills | Status: DC

## 2017-06-18 MED ORDER — PHENTERMINE HCL 15 MG PO CAPS: 15.0000 mg | ORAL_CAPSULE | ORAL | 0 refills | Status: DC

## 2017-06-18 MED FILL — TOPIRAMATE 25 MG TABLET: 25 | 30 days supply | Qty: 30 | Fill #0
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
## 2017-06-28 ENCOUNTER — Ambulatory Visit: Payer: Self-pay | Admitting: Family Medicine

## 2017-06-28 ENCOUNTER — Encounter

## 2017-07-01 ENCOUNTER — Ambulatory Visit (INDEPENDENT_AMBULATORY_CARE_PROVIDER_SITE_OTHER): Payer: Self-pay | Admitting: Family Medicine

## 2017-07-01 ENCOUNTER — Encounter: Payer: Self-pay | Admitting: Family Medicine

## 2017-07-01 DIAGNOSIS — G8929 Other chronic pain: Secondary | ICD-10-CM

## 2017-07-01 DIAGNOSIS — M25562 Pain in left knee: Secondary | ICD-10-CM

## 2017-07-01 DIAGNOSIS — M25561 Pain in right knee: Secondary | ICD-10-CM

## 2017-07-01 MED ORDER — METHYLPREDNISOLONE ACETATE 40 MG/ML IJ SUSP
40.0000 mg | Freq: Once | INTRAMUSCULAR | Status: AC
  Administered 2017-07-01: 40 mg via INTRA_ARTICULAR

## 2017-07-01 NOTE — Patient Instructions (Signed)
Your pain is due to arthritis. These are the different medications you can take for this: Tylenol 500mg 1-2 tabs three times a day for pain. Capsaicin, aspercreme, or biofreeze topically up to four times a day may also help with pain. Some supplements that may help for arthritis: Boswellia extract, curcumin, pycnogenol Aleve 1-2 tabs twice a day with food Cortisone injections are an option - you were given this today If cortisone injections do not help, there are different types of shots that may help but they take longer to take effect. It's important that you continue to stay active. Straight leg raises, knee extensions 3 sets of 10 once a day (add ankle weight if these become too easy). Consider physical therapy to strengthen muscles around the joint that hurts to take pressure off of the joint itself. Shoe inserts with good arch support may be helpful. Heat or ice 15 minutes at a time 3-4 times a day as needed to help with pain. Water aerobics and cycling with low resistance are the best two types of exercise for arthritis though any exercise is ok as long as it doesn't worsen the pain. Follow up with me in 1 month.  

## 2017-07-02 ENCOUNTER — Encounter: Payer: Self-pay | Admitting: Family Medicine
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
## 2017-07-02 NOTE — Assessment & Plan Note (Signed)
consistent with flare of mild arthritis.  Discussed tylenol, topical medications, supplements, aleve.  Intraarticular injection given today.  Home exercises reviewed.  F/u in 1 month.  After informed written consent timeout was performed, patient was seated on exam table. Left knee was prepped with alcohol swab and utilizing anteromedial approach, patient's left knee was injected intraarticularly with 3:1 bupivicaine: depomedrol. Patient tolerated the procedure well without immediate complications.

## 2017-07-18 ENCOUNTER — Ambulatory Visit: Payer: Self-pay | Attending: Internal Medicine | Admitting: Internal Medicine

## 2017-07-18 ENCOUNTER — Encounter: Payer: Self-pay | Admitting: Internal Medicine

## 2017-07-18 VITALS — BP 106/71 | HR 76 | Temp 98.4°F | Resp 16 | Wt 242.4 lb

## 2017-07-18 DIAGNOSIS — Z6841 Body Mass Index (BMI) 40.0 and over, adult: Secondary | ICD-10-CM

## 2017-07-18 DIAGNOSIS — F419 Anxiety disorder, unspecified: Secondary | ICD-10-CM | POA: Insufficient documentation

## 2017-07-18 DIAGNOSIS — F259 Schizoaffective disorder, unspecified: Secondary | ICD-10-CM | POA: Insufficient documentation

## 2017-07-18 DIAGNOSIS — F17210 Nicotine dependence, cigarettes, uncomplicated: Secondary | ICD-10-CM | POA: Insufficient documentation

## 2017-07-18 DIAGNOSIS — Z79899 Other long term (current) drug therapy: Secondary | ICD-10-CM | POA: Insufficient documentation

## 2017-07-18 DIAGNOSIS — G8929 Other chronic pain: Secondary | ICD-10-CM | POA: Insufficient documentation

## 2017-07-18 DIAGNOSIS — M7989 Other specified soft tissue disorders: Secondary | ICD-10-CM | POA: Insufficient documentation

## 2017-07-18 DIAGNOSIS — M542 Cervicalgia: Secondary | ICD-10-CM

## 2017-07-18 DIAGNOSIS — R42 Dizziness and giddiness: Secondary | ICD-10-CM | POA: Insufficient documentation

## 2017-07-18 DIAGNOSIS — R9389 Abnormal findings on diagnostic imaging of other specified body structures: Secondary | ICD-10-CM | POA: Insufficient documentation

## 2017-07-18 DIAGNOSIS — N946 Dysmenorrhea, unspecified: Secondary | ICD-10-CM | POA: Insufficient documentation

## 2017-07-18 DIAGNOSIS — N898 Other specified noninflammatory disorders of vagina: Secondary | ICD-10-CM | POA: Insufficient documentation

## 2017-07-18 DIAGNOSIS — M25561 Pain in right knee: Secondary | ICD-10-CM | POA: Insufficient documentation

## 2017-07-18 DIAGNOSIS — M25562 Pain in left knee: Secondary | ICD-10-CM | POA: Insufficient documentation

## 2017-07-18 DIAGNOSIS — F329 Major depressive disorder, single episode, unspecified: Secondary | ICD-10-CM | POA: Insufficient documentation

## 2017-07-18 DIAGNOSIS — K5900 Constipation, unspecified: Secondary | ICD-10-CM | POA: Insufficient documentation

## 2017-07-18 MED ORDER — TOPIRAMATE 25 MG PO TABS: 25.0000 mg | ORAL_TABLET | Freq: Every day | ORAL | 1 refills | Status: DC

## 2017-07-18 MED ORDER — CYCLOBENZAPRINE HCL 5 MG PO TABS
5.0000 mg | ORAL_TABLET | Freq: Two times a day (BID) | ORAL | 0 refills | Status: DC | PRN
Start: 2017-07-18 — End: 2021-04-06

## 2017-07-18 MED ORDER — IBUPROFEN 800 MG PO TABS: 800.0000 mg | ORAL_TABLET | Freq: Three times a day (TID) | ORAL | 0 refills | Status: DC | PRN

## 2017-07-18 MED ORDER — PHENTERMINE HCL 30 MG PO CAPS: 30.0000 mg | ORAL_CAPSULE | ORAL | 1 refills | Status: DC

## 2017-07-18 MED FILL — CYCLOBENZAPRINE 5 MG TABLET: 5 | 15 days supply | Qty: 30 | Fill #0

## 2017-07-18 MED FILL — TOPIRAMATE 25 MG TABS: 25 | 30 days supply | Qty: 30 | Fill #0
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
## 2017-07-19 DIAGNOSIS — Z6841 Body Mass Index (BMI) 40.0 and over, adult: Secondary | ICD-10-CM | POA: Insufficient documentation

## 2017-07-30 ENCOUNTER — Ambulatory Visit (INDEPENDENT_AMBULATORY_CARE_PROVIDER_SITE_OTHER): Payer: Self-pay | Admitting: Family Medicine

## 2017-07-30 ENCOUNTER — Encounter: Payer: Self-pay | Admitting: Family Medicine

## 2017-07-30 DIAGNOSIS — M25561 Pain in right knee: Secondary | ICD-10-CM

## 2017-07-30 DIAGNOSIS — M25562 Pain in left knee: Secondary | ICD-10-CM

## 2017-07-30 DIAGNOSIS — G8929 Other chronic pain: Secondary | ICD-10-CM

## 2017-07-30 NOTE — Patient Instructions (Signed)
We will go ahead with an MRI of your knee. I will call you with the results and next steps.

## 2017-07-31 ENCOUNTER — Encounter: Payer: Self-pay | Admitting: Family Medicine
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
## 2017-07-31 NOTE — Assessment & Plan Note (Signed)
Unfortunately not improving with home exercises, injection as would expect from mild arthritis flare.  Will go ahead with MRI to further assess for possible meniscus tear.  Discussed tylenol, topical medications, supplements, aleve.

## 2017-08-01 ENCOUNTER — Ambulatory Visit: Payer: Self-pay | Admitting: Family Medicine

## 2017-09-20 ENCOUNTER — Ambulatory Visit: Payer: Self-pay

## 2017-11-19 ENCOUNTER — Ambulatory Visit: Payer: Self-pay

## 2017-12-10 ENCOUNTER — Ambulatory Visit: Payer: Self-pay | Attending: Internal Medicine

## 2017-12-10 ENCOUNTER — Ambulatory Visit: Payer: Self-pay

## 2017-12-27 ENCOUNTER — Other Ambulatory Visit: Payer: Self-pay

## 2017-12-27 ENCOUNTER — Ambulatory Visit: Payer: Self-pay | Attending: Internal Medicine | Admitting: Internal Medicine

## 2017-12-27 ENCOUNTER — Encounter: Payer: Self-pay | Admitting: Internal Medicine

## 2017-12-27 VITALS — BP 112/74 | HR 70 | Temp 98.2°F | Resp 16 | Wt 268.0 lb

## 2017-12-27 DIAGNOSIS — R0609 Other forms of dyspnea: Secondary | ICD-10-CM | POA: Insufficient documentation

## 2017-12-27 DIAGNOSIS — Z79899 Other long term (current) drug therapy: Secondary | ICD-10-CM | POA: Insufficient documentation

## 2017-12-27 DIAGNOSIS — M25562 Pain in left knee: Secondary | ICD-10-CM | POA: Insufficient documentation

## 2017-12-27 DIAGNOSIS — M25561 Pain in right knee: Secondary | ICD-10-CM | POA: Insufficient documentation

## 2017-12-27 DIAGNOSIS — F259 Schizoaffective disorder, unspecified: Secondary | ICD-10-CM | POA: Insufficient documentation

## 2017-12-27 DIAGNOSIS — R600 Localized edema: Secondary | ICD-10-CM

## 2017-12-27 DIAGNOSIS — G8929 Other chronic pain: Secondary | ICD-10-CM | POA: Insufficient documentation

## 2017-12-27 DIAGNOSIS — F17210 Nicotine dependence, cigarettes, uncomplicated: Secondary | ICD-10-CM | POA: Insufficient documentation

## 2017-12-27 DIAGNOSIS — R609 Edema, unspecified: Secondary | ICD-10-CM | POA: Insufficient documentation

## 2017-12-27 DIAGNOSIS — Z6841 Body Mass Index (BMI) 40.0 and over, adult: Secondary | ICD-10-CM | POA: Insufficient documentation

## 2017-12-27 MED ORDER — FUROSEMIDE 20 MG PO TABS: 20.0000 mg | ORAL_TABLET | Freq: Every day | ORAL | 1 refills | Status: DC

## 2017-12-27 NOTE — Patient Instructions (Signed)

## 2017-12-27 NOTE — Progress Notes (Signed)
Pt states every now and then she feels something in her chest because she has stop smoking 8 months ago
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
## 2017-12-28 LAB — BASIC METABOLIC PANEL
BUN/Creatinine Ratio: 9 (ref 9–23)
BUN: 8 mg/dL (ref 6–24)
CO2: 23 mmol/L (ref 20–29)
CREATININE: 0.86 mg/dL (ref 0.57–1.00)
Calcium: 9.3 mg/dL (ref 8.7–10.2)
Chloride: 104 mmol/L (ref 96–106)
GFR, EST AFRICAN AMERICAN: 95 mL/min/{1.73_m2} (ref >59)
GFR, EST NON AFRICAN AMERICAN: 82 mL/min/{1.73_m2} (ref >59)
Glucose: 92 mg/dL (ref 65–99)
Potassium: 4.2 mmol/L (ref 3.5–5.2)
Sodium: 140 mmol/L (ref 134–144)

## 2017-12-28 LAB — BRAIN NATRIURETIC PEPTIDE: BNP: 61.3 pg/mL (ref 0.0–100.0)

## 2018-02-24 ENCOUNTER — Ambulatory Visit: Payer: Self-pay | Attending: Internal Medicine | Admitting: Internal Medicine

## 2018-02-24 ENCOUNTER — Encounter: Payer: Self-pay | Admitting: Internal Medicine

## 2018-02-24 VITALS — BP 113/76 | HR 78 | Temp 98.3°F | Resp 16 | Wt 276.4 lb

## 2018-02-24 DIAGNOSIS — M25561 Pain in right knee: Secondary | ICD-10-CM | POA: Insufficient documentation

## 2018-02-24 DIAGNOSIS — F259 Schizoaffective disorder, unspecified: Secondary | ICD-10-CM | POA: Insufficient documentation

## 2018-02-24 DIAGNOSIS — Z79899 Other long term (current) drug therapy: Secondary | ICD-10-CM | POA: Insufficient documentation

## 2018-02-24 DIAGNOSIS — G8929 Other chronic pain: Secondary | ICD-10-CM | POA: Insufficient documentation

## 2018-02-24 DIAGNOSIS — Z87891 Personal history of nicotine dependence: Secondary | ICD-10-CM | POA: Insufficient documentation

## 2018-02-24 DIAGNOSIS — Z6841 Body Mass Index (BMI) 40.0 and over, adult: Secondary | ICD-10-CM

## 2018-02-24 DIAGNOSIS — F17210 Nicotine dependence, cigarettes, uncomplicated: Secondary | ICD-10-CM

## 2018-02-24 DIAGNOSIS — F329 Major depressive disorder, single episode, unspecified: Secondary | ICD-10-CM | POA: Insufficient documentation

## 2018-02-24 DIAGNOSIS — M25562 Pain in left knee: Secondary | ICD-10-CM | POA: Insufficient documentation
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
## 2018-03-24 ENCOUNTER — Encounter

## 2018-03-24 ENCOUNTER — Encounter: Payer: Self-pay | Admitting: Family Medicine

## 2018-03-24 ENCOUNTER — Ambulatory Visit (INDEPENDENT_AMBULATORY_CARE_PROVIDER_SITE_OTHER): Payer: Self-pay | Admitting: Family Medicine

## 2018-03-24 VITALS — BP 136/90 | HR 73 | Ht 65.0 in | Wt 276.0 lb

## 2018-03-24 DIAGNOSIS — M722 Plantar fascial fibromatosis: Secondary | ICD-10-CM

## 2018-03-24 DIAGNOSIS — M25562 Pain in left knee: Secondary | ICD-10-CM

## 2018-03-24 DIAGNOSIS — M25561 Pain in right knee: Secondary | ICD-10-CM

## 2018-03-24 NOTE — Patient Instructions (Signed)
You have plantar fasciitis Take tylenol and/or aleve as needed for pain  Plantar fascia stretch for 20-30 seconds (do 3 of these) in morning Lowering/raise on a step exercises 3 x 10 once or twice a day - this is very important for long term recovery. Can add heel walks, toe walks forward and backward as well Ice heel for 15 minutes as needed. Avoid flat shoes/barefoot walking as much as possible. Arch straps have been shown to help with pain. Inserts are important (dr. Zoe Lan active series, spencos, superfeet, custom orthotics). Steroid injection is a consideration for short term pain relief if you are struggling. Physical therapy is also an option - start this Follow up with me in 6 weeks.  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 Straight leg raise, hip side raises, straight leg raises with foot turned outwards 3 sets of 10 once a day. Add ankle weight if these become too easy. Start formal physical therapy. Correct foot breakdown with something like dr. Zoe Lan active series, spencos, superfeet. Avoid flat shoes, barefoot walking as much as possible. Icing 15 minutes at a time 3-4 times a day as needed. Tylenol or aleve as needed. Follow up with me in 6 weeks.
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
## 2018-03-25 ENCOUNTER — Encounter: Payer: Self-pay | Admitting: Family Medicine

## 2018-05-05 ENCOUNTER — Encounter: Payer: Self-pay | Admitting: Family Medicine

## 2018-05-05 ENCOUNTER — Ambulatory Visit (INDEPENDENT_AMBULATORY_CARE_PROVIDER_SITE_OTHER): Payer: Self-pay | Admitting: Family Medicine

## 2018-05-05 VITALS — BP 128/76 | HR 100 | Ht 63.0 in | Wt 276.0 lb

## 2018-05-05 DIAGNOSIS — M25561 Pain in right knee: Secondary | ICD-10-CM

## 2018-05-05 DIAGNOSIS — M722 Plantar fascial fibromatosis: Secondary | ICD-10-CM

## 2018-05-05 DIAGNOSIS — M25562 Pain in left knee: Secondary | ICD-10-CM

## 2018-05-05 MED ORDER — METHYLPREDNISOLONE ACETATE 40 MG/ML IJ SUSP
40.0000 mg | Freq: Once | INTRAMUSCULAR | Status: AC
  Administered 2018-05-05: 40 mg via INTRA_ARTICULAR
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
## 2018-05-05 NOTE — Patient Instructions (Signed)
You have plantar fasciitis Take tylenol and/or aleve as needed for pain  Plantar fascia stretch for 20-30 seconds (do 3 of these) in morning Lowering/raise on a step exercises 3 x 10 once or twice a day - this is very important for long term recovery. Can add heel walks, toe walks forward and backward as well Ice heel for 15 minutes as needed. Avoid flat shoes/barefoot walking as much as possible. Arch straps have been shown to help with pain. Inserts are important (dr. Zoe Lan active series, spencos, superfeet, custom orthotics). You were given a steroid injection today. Follow up with me in 6 weeks. Call me sooner if you're not improving as expected and we would reorder physical therapy.

## 2018-05-06 ENCOUNTER — Encounter: Payer: Self-pay | Admitting: Family Medicine

## 2018-05-27 ENCOUNTER — Ambulatory Visit: Payer: Self-pay | Admitting: Internal Medicine

## 2020-08-16 ENCOUNTER — Other Ambulatory Visit: Payer: Self-pay

## 2020-08-16 ENCOUNTER — Ambulatory Visit: Payer: Medicaid Other | Admitting: Physician Assistant

## 2020-08-16 VITALS — BP 132/86 | HR 73 | Temp 98.2°F | Resp 18 | Ht 66.0 in | Wt 298.0 lb

## 2020-08-16 DIAGNOSIS — Z1231 Encounter for screening mammogram for malignant neoplasm of breast: Secondary | ICD-10-CM

## 2020-08-16 DIAGNOSIS — Z6841 Body Mass Index (BMI) 40.0 and over, adult: Secondary | ICD-10-CM

## 2020-08-16 DIAGNOSIS — K0889 Other specified disorders of teeth and supporting structures: Secondary | ICD-10-CM | POA: Insufficient documentation

## 2020-08-16 DIAGNOSIS — Z13228 Encounter for screening for other metabolic disorders: Secondary | ICD-10-CM

## 2020-08-16 DIAGNOSIS — Z1159 Encounter for screening for other viral diseases: Secondary | ICD-10-CM

## 2020-08-16 DIAGNOSIS — Z13220 Encounter for screening for lipoid disorders: Secondary | ICD-10-CM

## 2020-08-16 MED ORDER — ACETAMINOPHEN-CODEINE #3 300-30 MG PO TABS
1.0000 | ORAL_TABLET | ORAL | 0 refills | Status: AC | PRN
  Filled 2020-08-16: qty 30, 5d supply, fill #0

## 2020-08-16 MED ORDER — PENICILLIN V POTASSIUM 500 MG PO TABS
500.0000 mg | ORAL_TABLET | Freq: Three times a day (TID) | ORAL | 0 refills | Status: AC
  Filled 2020-08-16: qty 30, 10d supply, fill #0
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
## 2020-08-16 NOTE — Patient Instructions (Addendum)
You will take penicillin 3 times a day for the next 10 days.  I encourage you to continue using Tylenol to help with the pain, I sent a prescription of Tylenol 3 to help you for severe pain.  I strongly encourage you to call around to different dental locations, the way to be seen at [facility] through the orange card can be quite long.  Please return to the mobile unit to have fasting labs completed, we will have you fill out a scholarship application at that time to obtain a free mammogram.  I hope that you feel better soon  Julia Rad, PA-C Physician Assistant [facility] Medicine [email or web address]   Dental Abscess  A dental abscess is a collection of pus in or around a tooth that results from an infection. An abscess can cause pain in the affected area as well as other symptoms. Treatment is important to help with symptoms and to prevent theinfection from spreading. What are the causes? This condition is caused by a bacterial infection around the root of the tooth that involves the inner part of the tooth (pulp). It may result from: Severe tooth decay. Trauma to the tooth, such as a broken or chipped tooth, that allows bacteria to enter into the pulp. Severe gum disease around a tooth. What increases the risk? This condition is more likely to develop in males. It is also more likely to develop in people who: Have dental decay (cavities). Eat sugary snacks between meals. Use tobacco products. Have diabetes. Have a weakened disease-fighting system (immune system). Do not brush and care for their teeth regularly. What are the signs or symptoms? Symptoms of this condition include: Severe pain in and around the infected tooth. Swelling and redness around the infected tooth, in the mouth, or in the face. Tenderness. Pus drainage. Bad breath. Bitter taste in the mouth. Difficulty swallowing. Difficulty opening the  mouth. Nausea. Vomiting. Chills. Swollen neck glands. Fever. How is this diagnosed? This condition is diagnosed based on: Your symptoms and your medical and dental history. An examination of the infected tooth. During the exam, your dentist may tap on the infected tooth. You may also have X-rays of the affected area. How is this treated? This condition is treated by getting rid of the infection. This may be done with: Incision and drainage. This procedure is done by making an incision in the abscess to drain out the pus. Removing pus is the first priority in treating an abscess. Antibiotic medicines. These may be used in certain situations. Antibacterial mouth rinse. A root canal. This may be performed to save the tooth. Your dentist accesses the visible part of your tooth (crown) with a drill and removes any damaged pulp. Then the space is filled and sealed off. Tooth extraction. The tooth is pulled out if it cannot be saved by other treatment. You may also receive treatment for pain, such as: Acetaminophen or NSAIDs. Gels that contain a numbing medicine. An injection to block the pain near your nerve. Follow these instructions at home: Medicines Take over-the-counter and prescription medicines only as told by your dentist. If you were prescribed an antibiotic, take it as told by your dentist. Do not stop taking the antibiotic even if you start to feel better. If you were prescribed a gel that contains a numbing medicine, use it exactly as told in the directions. Do not use these gels for children who are younger than 7 years of age. Do not drive or use  heavy machinery while taking prescription pain medicine. General instructions  Rinse out your mouth often with salt water to relieve pain or swelling. To make a salt-water mixture, completely dissolve -1 tsp of salt in 1 cup of warm water. Eat a soft diet while your abscess is healing. Drink enough fluid to keep your urine pale  yellow. Do not apply heat to the outside of your mouth. Do not use any products that contain nicotine or tobacco, such as cigarettes and e-cigarettes. If you need help quitting, ask your health care provider. Keep all follow-up visits as told by your dentist. This is important.  How is this prevented? Brush your teeth every morning and night with fluoride toothpaste. Floss one time each day. Get regularly scheduled dental cleanings. Consider having a dental sealant applied on teeth that have deep holes (caries). Drink fluoridated water regularly. This includes most tap water. Check the label on bottled water to see if it contains fluoride. Drink water instead of sugary drinks. Eat healthy meals and snacks. Wear a mouth guard or face shield to protect your teeth while playing sports. Contact a health care provider if: Your pain is worse and is not helped by medicine. Get help right away if: You have a fever or chills. Your symptoms suddenly get worse. You have a very bad headache. You have problems breathing or swallowing. You have trouble opening your mouth. You have swelling in your neck or around your eye. Summary A dental abscess is a collection of pus in or around a tooth that results from an infection. A dental abscess may result from severe tooth decay, trauma to the tooth, or severe gum disease around a tooth. Symptoms include severe pain, swelling, redness, and drainage of pus in and around the infected tooth. The first priority in treating a dental abscess is to drain out the pus. Treatment may also involve removing damage inside the tooth (root canal) or pulling out (extracting) the tooth. This information is not intended to replace advice given to you by your health care provider. Make sure you discuss any questions you have with your healthcare provider. Document Revised: 07/24/2019 Document Reviewed: 09/04/2019 Elsevier Patient Education  [place].

## 2020-08-16 NOTE — Progress Notes (Signed)
Patient reports tooth pain beginning 5/28 with a numbing feeling and throbbing Patient has taken penicillin, tylenol, aleve, orajel, BC with minimal relief.

## 2020-08-17 ENCOUNTER — Ambulatory Visit: Payer: Medicaid Other | Admitting: *Deleted

## 2020-08-17 ENCOUNTER — Other Ambulatory Visit: Payer: Self-pay

## 2020-08-17 DIAGNOSIS — Z13228 Encounter for screening for other metabolic disorders: Secondary | ICD-10-CM

## 2020-08-17 DIAGNOSIS — Z13220 Encounter for screening for lipoid disorders: Secondary | ICD-10-CM

## 2020-08-17 DIAGNOSIS — Z1159 Encounter for screening for other viral diseases: Secondary | ICD-10-CM

## 2020-08-17 NOTE — Progress Notes (Signed)
Patients lab draw was unsuccessful x2. Patient has been scheduled with [facility] for retry on Friday.

## 2020-08-19 ENCOUNTER — Other Ambulatory Visit: Payer: Medicaid Other

## 2020-08-25 ENCOUNTER — Telehealth: Payer: Self-pay | Admitting: Internal Medicine

## 2020-08-25 ENCOUNTER — Other Ambulatory Visit: Payer: Self-pay

## 2020-08-25 ENCOUNTER — Ambulatory Visit: Payer: Self-pay | Attending: Internal Medicine

## 2020-08-25 DIAGNOSIS — K0889 Other specified disorders of teeth and supporting structures: Secondary | ICD-10-CM

## 2020-08-25 NOTE — Telephone Encounter (Signed)
Pt was advised post [facility]  APPT  to request an Urgent Referral for a dentist for toothache. Please advise and thank you

## 2020-08-25 NOTE — Telephone Encounter (Signed)
Pt was seen at the mobile bus on 08/16/20 for dental pain. Pt has been approved for the [facility] can you please place referral

## 2020-10-14 ENCOUNTER — Encounter (HOSPITAL_COMMUNITY): Payer: Self-pay | Admitting: Emergency Medicine

## 2020-10-14 ENCOUNTER — Other Ambulatory Visit: Payer: Self-pay

## 2020-10-14 ENCOUNTER — Emergency Department (HOSPITAL_COMMUNITY)
Admission: EM | Admit: 2020-10-14 | Discharge: 2020-10-14 | Disposition: A | Discharge status: Home / Self Care | Payer: Medicaid Other | Attending: Emergency Medicine | Admitting: Emergency Medicine

## 2020-10-14 DIAGNOSIS — Z87891 Personal history of nicotine dependence: Secondary | ICD-10-CM | POA: Insufficient documentation

## 2020-10-14 DIAGNOSIS — K0889 Other specified disorders of teeth and supporting structures: Secondary | ICD-10-CM | POA: Insufficient documentation

## 2020-10-14 MED ORDER — NAPROXEN 500 MG PO TABS
500.0000 mg | ORAL_TABLET | Freq: Two times a day (BID) | ORAL | 0 refills | Status: DC
  Filled 2020-10-14: qty 30, 15d supply, fill #0

## 2020-10-14 MED ORDER — CLINDAMYCIN HCL 150 MG PO CAPS
300.0000 mg | ORAL_CAPSULE | Freq: Three times a day (TID) | ORAL | 0 refills | Status: DC
  Filled 2020-10-14: qty 60, 10d supply, fill #0

## 2020-10-14 NOTE — ED Triage Notes (Signed)
Patient here for dental pain on left upper jaw.  She states that she had been having pain for the last few months, has been referred to dentist, but has not gone due to no phone call from dentist after being referred.  Patient has knot on upper jaw.
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
## 2020-10-14 NOTE — Discharge Instructions (Addendum)
Take the prescribed medication as directed-- sent to your pharmacy. Follow-up with dentist-- I have attached information for our dentist on  call as well as dental resource guide of out of pocket clinics locally. Return to the ED for new or worsening symptoms.

## 2020-10-14 NOTE — ED Provider Notes (Signed)
Emergency Medicine Provider Triage Evaluation Note  Julia Newman , a 45 y.o. female  was evaluated in triage.  Pt complains of left upper dental pain for the past several months.  States pain has been intermittent, worse the past few days.  Has been taking leftover penicillin from prior visit.  Awaiting dental referral through [facility].  Review of Systems  Positive: Dental pain Negative: fever  Physical Exam  BP (!) 147/79 (BP Location: Left Arm)   Pulse 78   Temp 98.5 F (36.9 C) (Oral)   Resp 16   SpO2 98%  Gen:   Awake, no distress   Resp:  Normal effort  MSK:   Moves extremities without difficulty  Other:  Teeth largely in poor dentition, left upper premolar broken and decayed centrally, surrounding gingiva mildly swollen and irritated, handling secretions appropriately, no facial or neck swelling noted  Medical Decision Making  Medically screening exam initiated at 1:49 AM.  Appropriate orders placed.  Kathi Ludwig was informed that the remainder of the evaluation will be completed by another provider, this initial triage assessment does not replace that evaluation, and the importance of remaining in the ED until their evaluation is complete.  Dental pain, possibly early abscess.   Larene Pickett, PA-C 10/14/20 [id number]    Veryl Speak, MD 10/14/20 [phone]

## 2020-10-18 ENCOUNTER — Other Ambulatory Visit: Payer: Self-pay

## 2020-11-17 ENCOUNTER — Other Ambulatory Visit (HOSPITAL_BASED_OUTPATIENT_CLINIC_OR_DEPARTMENT_OTHER): Payer: Self-pay

## 2020-11-17 MED ORDER — AMOXICILLIN 500 MG PO CAPS: ORAL_CAPSULE | ORAL | 0 refills | Status: DC

## 2021-03-15 ENCOUNTER — Telehealth: Payer: Self-pay | Admitting: Internal Medicine

## 2021-03-15 NOTE — Telephone Encounter (Signed)
Copied from [place] [phone] [id number]. Topic: General - Other >> Mar 14, 2021  3:21 PM Bayard Beaver wrote: Reason for CRM: Patient called  in to renew orange card, Please call back

## 2021-03-16 NOTE — Telephone Encounter (Signed)
I return Pt call, she was inform that she need to schedule an appt with the provider ten can schedule an appt with financial

## 2021-03-27 ENCOUNTER — Other Ambulatory Visit: Payer: Self-pay

## 2021-03-27 ENCOUNTER — Ambulatory Visit: Payer: Self-pay | Attending: Internal Medicine

## 2021-03-29 ENCOUNTER — Emergency Department (HOSPITAL_COMMUNITY)
Admission: EM | Admit: 2021-03-29 | Discharge: 2021-03-29 | Disposition: A | Discharge status: Home / Self Care | Payer: No Typology Code available for payment source | Attending: Student | Admitting: Student

## 2021-03-29 ENCOUNTER — Emergency Department (HOSPITAL_COMMUNITY): Payer: No Typology Code available for payment source

## 2021-03-29 ENCOUNTER — Other Ambulatory Visit: Payer: Self-pay

## 2021-03-29 DIAGNOSIS — Z87891 Personal history of nicotine dependence: Secondary | ICD-10-CM | POA: Insufficient documentation

## 2021-03-29 DIAGNOSIS — S0990XA Unspecified injury of head, initial encounter: Secondary | ICD-10-CM | POA: Diagnosis present

## 2021-03-29 DIAGNOSIS — S199XXA Unspecified injury of neck, initial encounter: Secondary | ICD-10-CM | POA: Insufficient documentation

## 2021-03-29 DIAGNOSIS — Y92410 Unspecified street and highway as the place of occurrence of the external cause: Secondary | ICD-10-CM | POA: Insufficient documentation

## 2021-03-29 DIAGNOSIS — R519 Headache, unspecified: Secondary | ICD-10-CM

## 2021-03-29 MED ORDER — PROCHLORPERAZINE EDISYLATE 10 MG/2ML IJ SOLN
10.0000 mg | Freq: Once | INTRAMUSCULAR | Status: AC
  Administered 2021-03-29: 10 mg via INTRAVENOUS
  Filled 2021-03-29: qty 2

## 2021-03-29 MED ORDER — KETOROLAC TROMETHAMINE 15 MG/ML IJ SOLN
15.0000 mg | Freq: Once | INTRAMUSCULAR | Status: AC
  Administered 2021-03-29: 15 mg via INTRAVENOUS
  Filled 2021-03-29: qty 1

## 2021-03-29 MED ORDER — DIPHENHYDRAMINE HCL 50 MG/ML IJ SOLN
25.0000 mg | Freq: Once | INTRAMUSCULAR | Status: AC
  Administered 2021-03-29: 25 mg via INTRAVENOUS
  Filled 2021-03-29: qty 1
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
## 2021-04-06 ENCOUNTER — Ambulatory Visit: Payer: Self-pay | Attending: Internal Medicine | Admitting: Internal Medicine

## 2021-04-06 ENCOUNTER — Other Ambulatory Visit: Payer: Self-pay

## 2021-04-06 ENCOUNTER — Encounter: Payer: Self-pay | Admitting: Internal Medicine

## 2021-04-06 VITALS — BP 115/74 | HR 82 | Resp 16 | Ht 63.0 in | Wt 301.0 lb

## 2021-04-06 DIAGNOSIS — R609 Edema, unspecified: Secondary | ICD-10-CM

## 2021-04-06 DIAGNOSIS — Z6841 Body Mass Index (BMI) 40.0 and over, adult: Secondary | ICD-10-CM

## 2021-04-06 DIAGNOSIS — M65341 Trigger finger, right ring finger: Secondary | ICD-10-CM

## 2021-04-06 DIAGNOSIS — B353 Tinea pedis: Secondary | ICD-10-CM

## 2021-04-06 DIAGNOSIS — G43001 Migraine without aura, not intractable, with status migrainosus: Secondary | ICD-10-CM

## 2021-04-06 DIAGNOSIS — D649 Anemia, unspecified: Secondary | ICD-10-CM

## 2021-04-06 MED ORDER — FUROSEMIDE 20 MG PO TABS
ORAL_TABLET | ORAL | 3 refills | Status: AC
  Filled 2021-04-06: qty 30, 30d supply, fill #0

## 2021-04-06 MED ORDER — IBUPROFEN 600 MG PO TABS
600.0000 mg | ORAL_TABLET | Freq: Two times a day (BID) | ORAL | 1 refills | Status: AC | PRN
  Filled 2021-04-06: qty 30, 15d supply, fill #0

## 2021-04-06 MED ORDER — TOPIRAMATE 25 MG PO TABS
25.0000 mg | ORAL_TABLET | Freq: Every day | ORAL | 1 refills | Status: AC
  Filled 2021-04-06: qty 30, 30d supply, fill #0
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
## 2021-04-06 NOTE — Patient Instructions (Signed)
Healthy Eating °Following a healthy eating pattern may help you to achieve and maintain a healthy body weight, reduce the risk of chronic disease, and live a long and productive life. It is important to follow a healthy eating pattern at an appropriate calorie level for your body. Your nutritional needs should be met primarily through food by choosing a variety of nutrient-rich foods. °What are tips for following this plan? °Reading food labels °Read labels and choose the following: °Reduced or low sodium. °Juices with 100% fruit juice. °Foods with low saturated fats and high polyunsaturated and monounsaturated fats. °Foods with whole grains, such as whole wheat, cracked wheat, brown rice, and wild rice. °Whole grains that are fortified with folic acid. This is recommended for women who are pregnant or who want to become pregnant. °Read labels and avoid the following: °Foods with a lot of added sugars. These include foods that contain brown sugar, corn sweetener, corn syrup, dextrose, fructose, glucose, high-fructose corn syrup, honey, invert sugar, lactose, malt syrup, maltose, molasses, raw sugar, sucrose, trehalose, or turbinado sugar. °Do not eat more than the following amounts of added sugar per day: °6 teaspoons (25 g) for women. °9 teaspoons (38 g) for men. °Foods that contain processed or refined starches and grains. °Refined grain products, such as white flour, degermed cornmeal, white bread, and white rice. °Shopping °Choose nutrient-rich snacks, such as vegetables, whole fruits, and nuts. Avoid high-calorie and high-sugar snacks, such as potato chips, fruit snacks, and candy. °Use oil-based dressings and spreads on foods instead of solid fats such as butter, stick margarine, or cream cheese. °Limit pre-made sauces, mixes, and "instant" products such as flavored rice, instant noodles, and ready-made pasta. °Try more plant-protein sources, such as tofu, tempeh, black beans, edamame, lentils, nuts, and  seeds. °Explore eating plans such as the Mediterranean diet or vegetarian diet. °Cooking °Use oil to sauté or stir-fry foods instead of solid fats such as butter, stick margarine, or lard. °Try baking, boiling, grilling, or broiling instead of frying. °Remove the fatty part of meats before cooking. °Steam vegetables in water or broth. °Meal planning ° °At meals, imagine dividing your plate into fourths: °One-half of your plate is fruits and vegetables. °One-fourth of your plate is whole grains. °One-fourth of your plate is protein, especially lean meats, poultry, eggs, tofu, beans, or nuts. °Include low-fat dairy as part of your daily diet. °Lifestyle °Choose healthy options in all settings, including home, work, school, restaurants, or stores. °Prepare your food safely: °Wash your hands after handling raw meats. °Keep food preparation surfaces clean by regularly washing with hot, soapy water. °Keep raw meats separate from ready-to-eat foods, such as fruits and vegetables. °Cook seafood, meat, poultry, and eggs to the recommended internal temperature. °Store foods at safe temperatures. In general: °Keep cold foods at 40°F (4.4°C) or below. °Keep hot foods at 140°F (60°C) or above. °Keep your freezer at 0°F (-17.8°C) or below. °Foods are no longer safe to eat when they have been between the temperatures of 40°-140°F (4.4-60°C) for more than 2 hours. °What foods should I eat? °Fruits °Aim to eat 2 cup-equivalents of fresh, canned (in natural juice), or frozen fruits each day. Examples of 1 cup-equivalent of fruit include 1 small apple, 8 large strawberries, 1 cup canned fruit, ½ cup dried fruit, or 1 cup 100% juice. °Vegetables °Aim to eat 2½-3 cup-equivalents of fresh and frozen vegetables each day, including different varieties and colors. Examples of 1 cup-equivalent of vegetables include 2 medium carrots, 2 cups raw,   leafy greens, 1 cup chopped vegetable (raw or cooked), or 1 medium baked potato. °Grains °Aim to  eat 6 ounce-equivalents of whole grains each day. Examples of 1 ounce-equivalent of grains include 1 slice of bread, 1 cup ready-to-eat cereal, 3 cups popcorn, or ½ cup cooked rice, pasta, or cereal. °Meats and other proteins °Aim to eat 5-6 ounce-equivalents of protein each day. Examples of 1 ounce-equivalent of protein include 1 egg, 1/2 cup nuts or seeds, or 1 tablespoon (16 g) peanut butter. A cut of meat or fish that is the size of a deck of cards is about 3-4 ounce-equivalents. °Of the protein you eat each week, try to have at least 8 ounces come from seafood. This includes salmon, trout, herring, and anchovies. °Dairy °Aim to eat 3 cup-equivalents of fat-free or low-fat dairy each day. Examples of 1 cup-equivalent of dairy include 1 cup (240 mL) milk, 8 ounces (250 g) yogurt, 1½ ounces (44 g) natural cheese, or 1 cup (240 mL) fortified soy milk. °Fats and oils °Aim for about 5 teaspoons (21 g) per day. Choose monounsaturated fats, such as canola and olive oils, avocados, peanut butter, and most nuts, or polyunsaturated fats, such as sunflower, corn, and soybean oils, walnuts, pine nuts, sesame seeds, sunflower seeds, and flaxseed. °Beverages °Aim for six 8-oz glasses of water per day. Limit coffee to three to five 8-oz cups per day. °Limit caffeinated beverages that have added calories, such as soda and energy drinks. °Limit alcohol intake to no more than 1 drink a day for nonpregnant women and 2 drinks a day for men. One drink equals 12 oz of beer (355 mL), 5 oz of wine (148 mL), or 1½ oz of hard liquor (44 mL). °Seasoning and other foods °Avoid adding excess amounts of salt to your foods. Try flavoring foods with herbs and spices instead of salt. °Avoid adding sugar to foods. °Try using oil-based dressings, sauces, and spreads instead of solid fats. °This information is based on general [place] nutrition guidelines. For more information, visit choosemyplate.gov. Exact amounts may vary based on your nutrition  needs. °Summary °A healthy eating plan may help you to maintain a healthy weight, reduce the risk of chronic diseases, and stay active throughout your life. °Plan your meals. Make sure you eat the right portions of a variety of nutrient-rich foods. °Try baking, boiling, grilling, or broiling instead of frying. °Choose healthy options in all settings, including home, work, school, restaurants, or stores. °This information is not intended to replace advice given to you by your health care provider. Make sure you discuss any questions you have with your health care provider. °Document Revised: 10/11/2020 Document Reviewed: 10/11/2020 °Elsevier Patient Education © 2022 Else[employer] Inc. ° °

## 2021-04-07 ENCOUNTER — Other Ambulatory Visit: Payer: Self-pay

## 2021-04-07 LAB — COMPREHENSIVE METABOLIC PANEL
ALT: 13 IU/L (ref 0–32)
AST: 18 IU/L (ref 0–40)
Albumin/Globulin Ratio: 1.5 (ref 1.2–2.2)
Albumin: 4.4 g/dL (ref 3.8–4.8)
Alkaline Phosphatase: 90 IU/L (ref 44–121)
BUN/Creatinine Ratio: 13 (ref 9–23)
BUN: 11 mg/dL (ref 6–24)
Bilirubin Total: 0.4 mg/dL (ref 0.0–1.2)
CO2: 21 mmol/L (ref 20–29)
Calcium: 9.2 mg/dL (ref 8.7–10.2)
Chloride: 104 mmol/L (ref 96–106)
Creatinine, Ser: 0.83 mg/dL (ref 0.57–1.00)
Globulin, Total: 2.9 g/dL (ref 1.5–4.5)
Glucose: 111 mg/dL — ABNORMAL HIGH (ref 70–99)
Potassium: 4.1 mmol/L (ref 3.5–5.2)
Sodium: 140 mmol/L (ref 134–144)
Total Protein: 7.3 g/dL (ref 6.0–8.5)
eGFR: 87 mL/min/{1.73_m2} (ref >59)

## 2021-04-07 LAB — CBC
Hematocrit: 31.5 % — ABNORMAL LOW (ref 34.0–46.6)
Hemoglobin: 9.8 g/dL — ABNORMAL LOW (ref 11.1–15.9)
MCH: 25.8 pg — ABNORMAL LOW (ref 26.6–33.0)
MCHC: 31.1 g/dL — ABNORMAL LOW (ref 31.5–35.7)
MCV: 83 fL (ref 79–97)
Platelets: 384 10*3/uL (ref 150–450)
RBC: 3.8 x10E6/uL (ref 3.77–5.28)
RDW: 14.5 % (ref 11.7–15.4)
WBC: 4.7 10*3/uL (ref 3.4–10.8)

## 2021-04-07 LAB — LIPID PANEL
Chol/HDL Ratio: 3.5 ratio (ref 0.0–4.4)
Cholesterol, Total: 197 mg/dL (ref 100–199)
HDL: 57 mg/dL (ref >39)
LDL Chol Calc (NIH): 121 mg/dL — ABNORMAL HIGH (ref 0–99)
Triglycerides: 105 mg/dL (ref 0–149)
VLDL Cholesterol Cal: 19 mg/dL (ref 5–40)

## 2021-04-07 LAB — HEMOGLOBIN A1C
Est. average glucose Bld gHb Est-mCnc: 126 mg/dL
Hgb A1c MFr Bld: 6 % — ABNORMAL HIGH (ref 4.8–5.6)

## 2021-04-07 LAB — BRAIN NATRIURETIC PEPTIDE: BNP: 16.6 pg/mL (ref 0.0–100.0)

## 2021-04-08 DIAGNOSIS — M65341 Trigger finger, right ring finger: Secondary | ICD-10-CM | POA: Insufficient documentation

## 2021-04-08 DIAGNOSIS — D649 Anemia, unspecified: Secondary | ICD-10-CM | POA: Insufficient documentation

## 2021-04-08 DIAGNOSIS — R609 Edema, unspecified: Secondary | ICD-10-CM | POA: Insufficient documentation

## 2021-04-11 ENCOUNTER — Telehealth: Payer: Self-pay | Admitting: Internal Medicine

## 2021-04-11 LAB — IRON,TIBC AND FERRITIN PANEL
Ferritin: 7 ng/mL — ABNORMAL LOW (ref 15–150)
Iron Saturation: 10 % — ABNORMAL LOW (ref 15–55)
Iron: 39 ug/dL (ref 27–159)
Total Iron Binding Capacity: 410 ug/dL (ref 250–450)
UIBC: 371 ug/dL (ref 131–425)

## 2021-04-11 LAB — SPECIMEN STATUS REPORT

## 2021-04-11 NOTE — Telephone Encounter (Signed)
Pt was sent a letter from financial dept. Inform them, that the application they submitted was incomplete, since they were missing some documentation at the time of the appointment, Pt need to reschedule and resubmit all new papers and application for CAFA and [place], P.S. old documents has been sent back by mail to the Pt and Pt. need to make a new appt. 

## 2021-04-12 ENCOUNTER — Encounter: Payer: Self-pay | Admitting: Internal Medicine

## 2021-04-12 ENCOUNTER — Other Ambulatory Visit: Payer: Self-pay | Admitting: Internal Medicine

## 2021-04-12 ENCOUNTER — Other Ambulatory Visit: Payer: Self-pay

## 2021-04-12 DIAGNOSIS — E611 Iron deficiency: Secondary | ICD-10-CM | POA: Insufficient documentation

## 2021-04-12 MED ORDER — FERROUS SULFATE 325 (65 FE) MG PO TABS
325.0000 mg | ORAL_TABLET | Freq: Every day | ORAL | 0 refills | Status: AC
  Filled 2021-04-12: qty 100, 100d supply, fill #0

## 2021-04-19 ENCOUNTER — Other Ambulatory Visit: Payer: Self-pay

## 2021-05-25 ENCOUNTER — Ambulatory Visit: Payer: Medicaid Other | Admitting: Internal Medicine

## 2021-08-03 ENCOUNTER — Ambulatory Visit: Payer: Medicaid Other | Admitting: Physician Assistant

## 2021-10-23 ENCOUNTER — Ambulatory Visit: Payer: Medicaid Other | Admitting: Internal Medicine

## 2021-11-10 ENCOUNTER — Ambulatory Visit: Payer: Medicaid Other | Admitting: Internal Medicine

## 2021-11-17 ENCOUNTER — Telehealth: Payer: Self-pay | Admitting: Internal Medicine

## 2021-11-17 NOTE — Telephone Encounter (Signed)
Pt is calling to ask how she can renew her orange card? Pt has dental and medical appts coming up soon and would like to know if she should reschedule her dental appt in 12/23  CB- [phone]

## 2021-11-23 NOTE — Telephone Encounter (Signed)
Patient given number to call about renewing for orange card.

## 2021-11-30 ENCOUNTER — Telehealth: Payer: Self-pay | Admitting: Physician Assistant

## 2021-11-30 ENCOUNTER — Other Ambulatory Visit (HOSPITAL_COMMUNITY): Payer: Self-pay

## 2021-11-30 DIAGNOSIS — B353 Tinea pedis: Secondary | ICD-10-CM

## 2021-11-30 MED ORDER — TERBINAFINE HCL 250 MG PO TABS
250.0000 mg | ORAL_TABLET | Freq: Every day | ORAL | 0 refills | Status: DC
  Filled 2021-11-30: qty 7, 7d supply, fill #0

## 2021-11-30 NOTE — Progress Notes (Signed)
E-Visit for Athlete's Foot  We are sorry that you are not feeling well. Here is how we plan to help!  Based on what you shared with me it looks like you have tinea pedis, or "Athlete's Foot".  This type of rash can spread through shared towels, clothing, bedding, etc., as well as hard surfaces (particularly in moist areas) such as shower stalls, locker room floors, pool areas, etc. The symptoms of Athlete's Foot include red, swollen, peeling, itchy skin between the toes (especially between the pinky toe and the one next to it). The sole and heel of the foot may also be affected. In severe cases, the skin on the feet can blister.  Athlete's foot can usually be treated with over-the-counter topical antifungal products; but sometimes with chronic or extensive tinea pedis, prescription oral medications are needed.   Prescription medications are only indicated for an extensive rash or if over the counter treatments have failed.  I am prescribing:Terbinafine 250 mg once per day for one week  This is most likely not going to be long enough for a full treatment, but I do not see any labs on file any more recent than February 2023. Lab monitoring is required for long-term management. This will help calm down the infection, but you will require face to face for longer treatment and lab monitoring while on the treatment.   HOME CARE:  Keep feet clean, dry, and cool. Avoid using swimming pools, public showers, or foot baths. Wear sandals when possible or air shoes out by alternating them every 2-3 days. Avoid wearing closed shoes and wearing socks made from fabric that doesn't dry easily (for example, nylon). Treat the infection with recommended medication  GET HELP RIGHT AWAY IF:  Symptoms that don't go away after treatment. Severe itching that persists. If your rash spreads or swells. If your rash begins to have drainage or smell. You develop a fever.  MAKE SURE YOU   Understand these  instructions. Will watch your condition. Will get help right away if you are not doing well or get worse.   Thank you for choosing an e-visit.  Your e-visit answers were reviewed by a board certified advanced clinical practitioner to complete your personal care plan. Depending upon the condition, your plan could have included both over the counter or prescription medications.  Please review your pharmacy choice. Make sure the pharmacy is open so you can pick up prescription now. If there is a problem, you may contact your provider through [employer] and have the prescription routed to another pharmacy.  Your safety is important to [place]. If you have drug allergies check your prescription carefully.   For the next 24 hours you can use MyChart to ask questions about today's visit, request a non-urgent call back, or ask for a work or school excuse.  You will get an email in the next two days asking about your experience. I hope that your e-visit has been valuable and will speed your recovery  References or for more information:  [employer]?search=athletes%28fot%20treatment&source=search_result&selectedTitle=1~104&usage_type=default&display_rank=1  [email or web address] I provided 5 minutes of non face-to-face time during this encounter for chart review and documentation.

## 2021-12-01 ENCOUNTER — Other Ambulatory Visit: Payer: Self-pay

## 2021-12-01 MED ORDER — TERBINAFINE HCL 250 MG PO TABS
250.0000 mg | ORAL_TABLET | Freq: Every day | ORAL | 0 refills | Status: AC
  Filled 2021-12-01: qty 7, 7d supply, fill #0

## 2021-12-01 NOTE — Addendum Note (Signed)
Addended by: Mar Daring on: 12/01/2021 07:05 AM   Modules accepted: Orders

## 2022-02-15 ENCOUNTER — Ambulatory Visit: Payer: Medicaid Other | Admitting: Internal Medicine

## 2022-05-11 ENCOUNTER — Emergency Department (HOSPITAL_BASED_OUTPATIENT_CLINIC_OR_DEPARTMENT_OTHER)
Admission: EM | Admit: 2022-05-11 | Discharge: 2022-05-11 | Disposition: A | Discharge status: Home / Self Care | Payer: Medicaid Other | Attending: Emergency Medicine | Admitting: Emergency Medicine

## 2022-05-11 ENCOUNTER — Emergency Department (HOSPITAL_BASED_OUTPATIENT_CLINIC_OR_DEPARTMENT_OTHER): Payer: Medicaid Other

## 2022-05-11 ENCOUNTER — Encounter (HOSPITAL_BASED_OUTPATIENT_CLINIC_OR_DEPARTMENT_OTHER): Payer: Self-pay | Admitting: Emergency Medicine

## 2022-05-11 ENCOUNTER — Emergency Department (HOSPITAL_BASED_OUTPATIENT_CLINIC_OR_DEPARTMENT_OTHER): Payer: Medicaid Other | Admitting: Radiology

## 2022-05-11 DIAGNOSIS — Y92410 Unspecified street and highway as the place of occurrence of the external cause: Secondary | ICD-10-CM | POA: Insufficient documentation

## 2022-05-11 DIAGNOSIS — M5440 Lumbago with sciatica, unspecified side: Secondary | ICD-10-CM | POA: Diagnosis present

## 2022-05-11 DIAGNOSIS — R0789 Other chest pain: Secondary | ICD-10-CM | POA: Diagnosis not present

## 2022-05-11 MED ORDER — LIDOCAINE 5 % EX PTCH: 1.0000 | MEDICATED_PATCH | CUTANEOUS | 0 refills | Status: AC

## 2022-05-11 MED ORDER — KETOROLAC TROMETHAMINE 30 MG/ML IJ SOLN
30.0000 mg | Freq: Once | INTRAMUSCULAR | Status: AC
  Administered 2022-05-11: 30 mg via INTRAMUSCULAR

## 2022-05-11 MED ORDER — CYCLOBENZAPRINE HCL 5 MG PO TABS
5.0000 mg | ORAL_TABLET | Freq: Once | ORAL | Status: AC
  Administered 2022-05-11: 5 mg via ORAL
  Filled 2022-05-11: qty 1

## 2022-05-11 MED ORDER — CYCLOBENZAPRINE HCL 5 MG PO TABS: 10.0000 mg | ORAL_TABLET | Freq: Three times a day (TID) | ORAL | 0 refills | Status: AC | PRN

## 2022-05-11 MED ORDER — LIDOCAINE 5 % EX PTCH
1.0000 | MEDICATED_PATCH | CUTANEOUS | Status: DC
  Administered 2022-05-11: 1 via TRANSDERMAL
  Filled 2022-05-11: qty 1

## 2022-05-11 MED ORDER — KETOROLAC TROMETHAMINE 10 MG PO TABS: 10.0000 mg | ORAL_TABLET | Freq: Four times a day (QID) | ORAL | 0 refills | Status: AC | PRN
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
## 2022-05-11 NOTE — Discharge Instructions (Signed)
Please follow-up with your primary care doctor, return to the ER if you have any loss of bowel, bladder, inability to walk.  Make sure you are drinking lots of fluids, and resting and taking medications as prescribed.  You can take Toradol up to 4 times a day, and please do not take ibuprofen or other NSAIDs with this.  You can augment your pain control with Tylenol.  Additionally you can take Flexeril but make sure you are not taking it when driving or making big decisions.
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
## 2023-05-15 ENCOUNTER — Ambulatory Visit: Admitting: Family Medicine
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
